# Patient Record
Sex: Female | Born: 1965 | Race: Black or African American | Hispanic: No | Marital: Single | Smoking: Current every day smoker
Health system: Southern US, Community
[De-identification: ages and names within clinical notes are randomized; demographics above are authoritative.]

## PROBLEM LIST (undated history)

## (undated) DIAGNOSIS — N289 Disorder of kidney and ureter, unspecified: Secondary | ICD-10-CM

## (undated) HISTORY — PX: ANKLE SURGERY: SHX546

---

## 2005-06-21 ENCOUNTER — Emergency Department: Payer: Self-pay | Admitting: Internal Medicine

## 2005-06-21 ENCOUNTER — Other Ambulatory Visit: Payer: Self-pay

## 2006-05-24 ENCOUNTER — Emergency Department: Payer: Self-pay | Admitting: Emergency Medicine

## 2006-07-07 ENCOUNTER — Other Ambulatory Visit: Payer: Self-pay

## 2006-07-07 ENCOUNTER — Emergency Department: Payer: Self-pay | Admitting: Emergency Medicine

## 2006-08-27 ENCOUNTER — Emergency Department: Payer: Self-pay | Admitting: General Practice

## 2006-08-27 ENCOUNTER — Other Ambulatory Visit: Payer: Self-pay

## 2006-09-26 ENCOUNTER — Emergency Department: Payer: Self-pay | Admitting: Emergency Medicine

## 2006-09-26 ENCOUNTER — Other Ambulatory Visit: Payer: Self-pay

## 2010-02-28 ENCOUNTER — Emergency Department: Payer: Self-pay | Admitting: Unknown Physician Specialty

## 2010-03-24 ENCOUNTER — Emergency Department: Payer: Self-pay | Admitting: Emergency Medicine

## 2012-03-13 ENCOUNTER — Emergency Department: Payer: Self-pay | Admitting: Emergency Medicine

## 2012-03-17 ENCOUNTER — Emergency Department: Payer: Self-pay | Admitting: Emergency Medicine

## 2014-05-17 IMAGING — CR FACIAL BONES - 1-2 VIEW
1 series · 4 of 4 positions shown · non-contrast
Comparison: none

REASON FOR EXAM: left peri-orbital lac/orbital contusion/head contusion
COMMENTS:   May transport without cardiac monitor

[Series 1: w waters pa · 0.14mm/px · 4 of 4 slices shown]
[im 1/4]
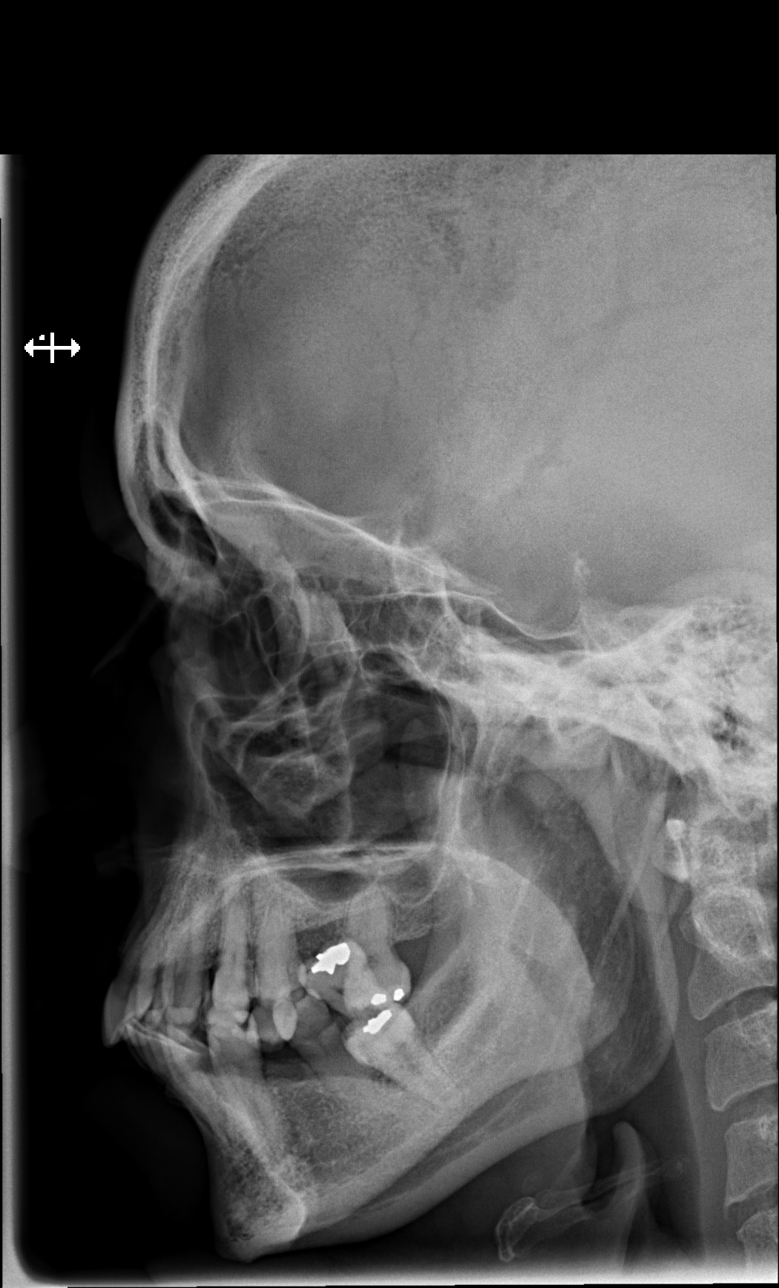
[im 2/4]
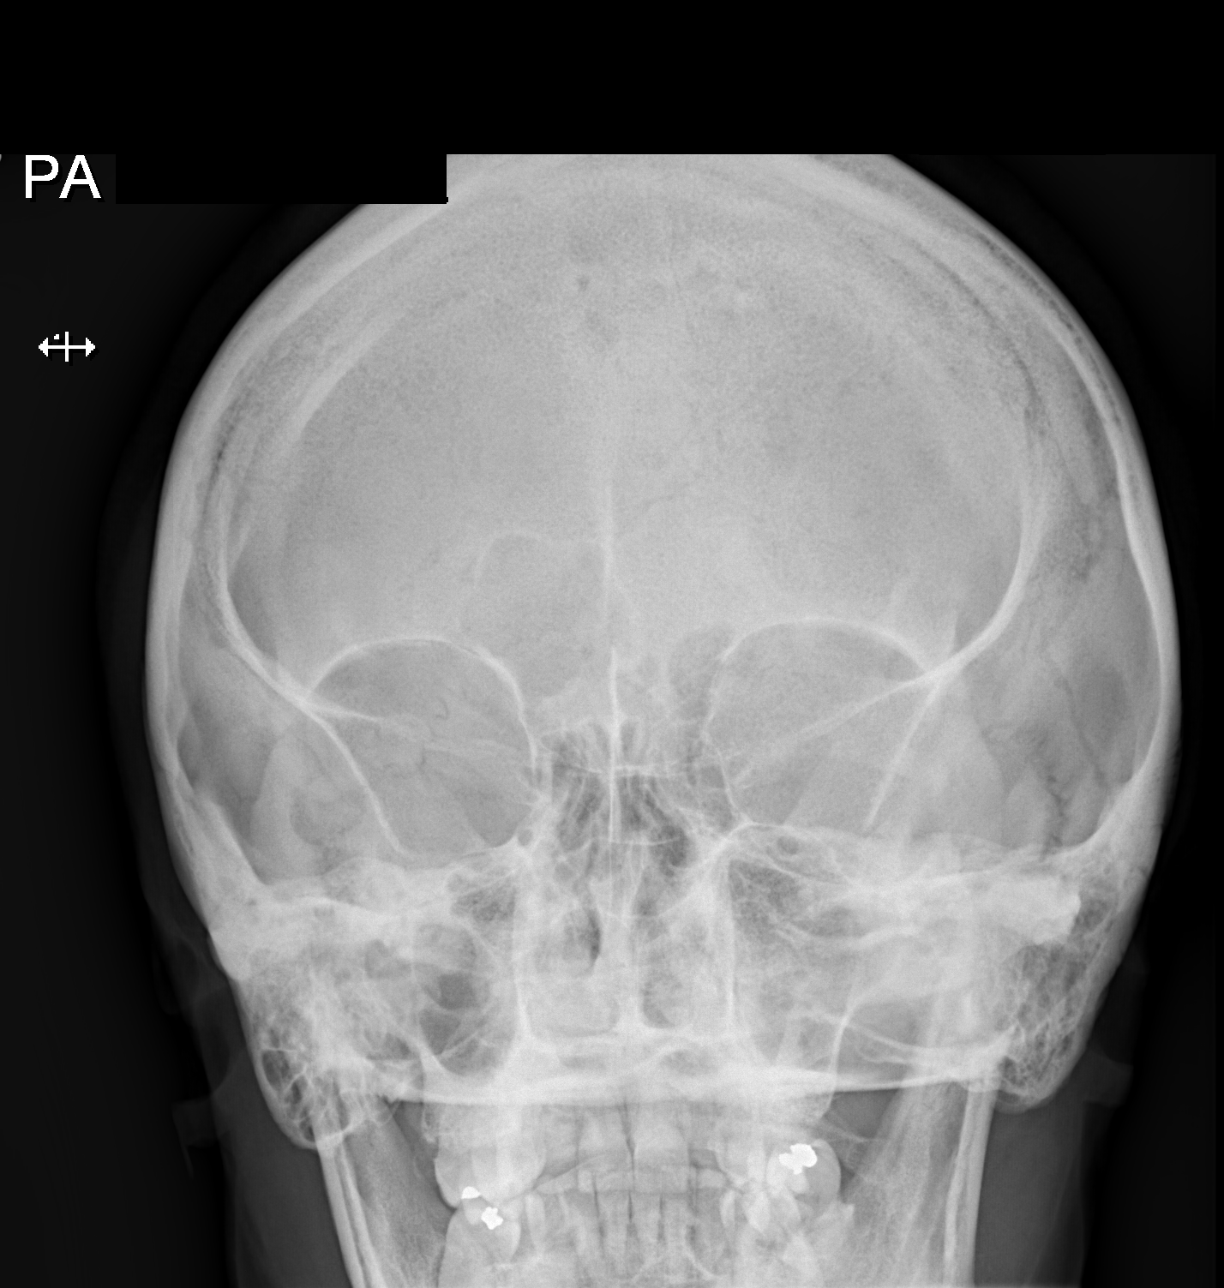
[im 3/4]
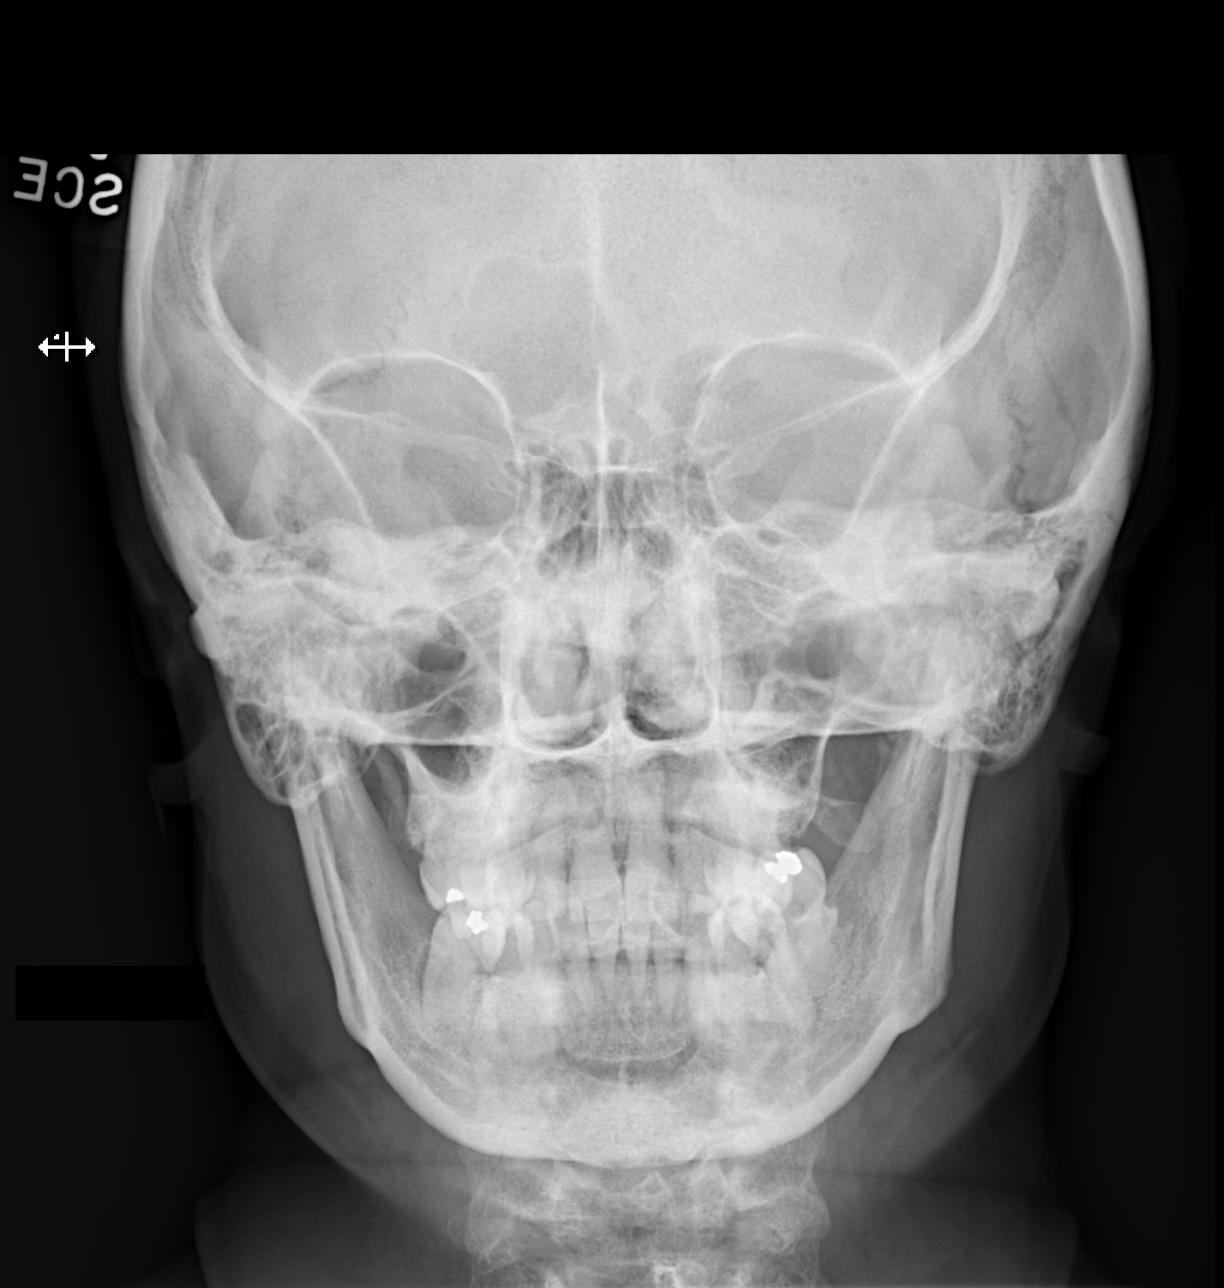
[im 4/4]
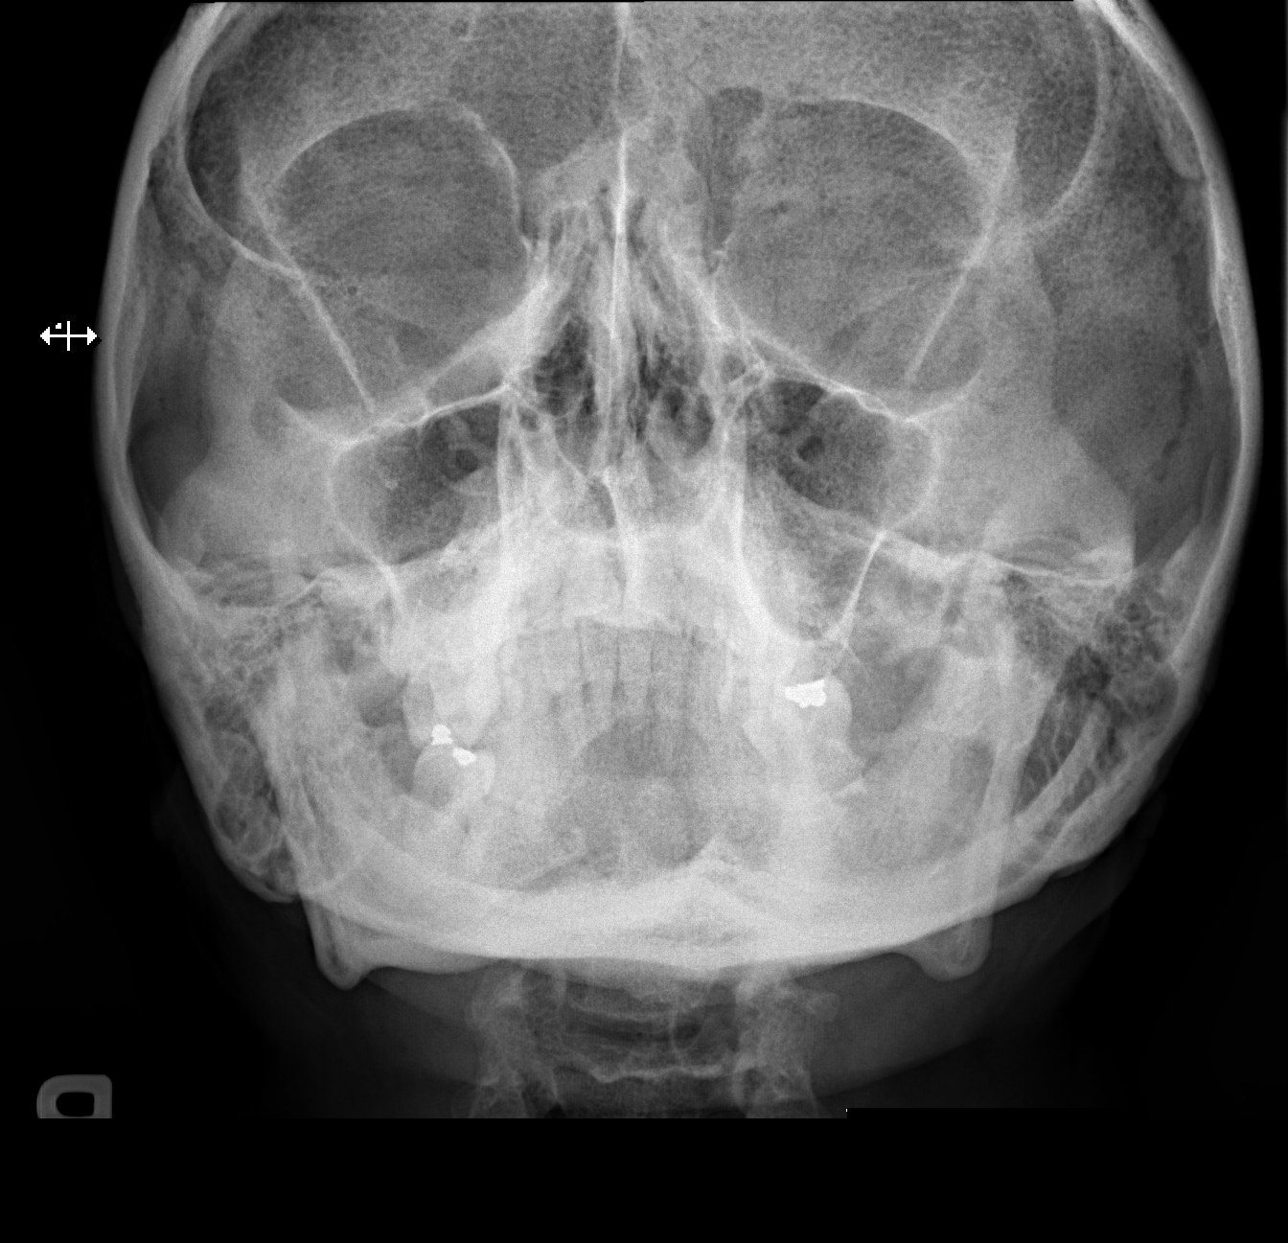

[4 of 4 positions shown; findings below may reference images not displayed]

PROCEDURE:     DXR - DXR FACIAL BONES LIMITED  - March 17, 2012 [DATE]

RESULT:     The facial bone images show minimal lucency in the floor of the
right orbit without depression. This could represent a foramen. The
possibility of a nondisplaced or incomplete fracture should be considered
and correlated clinically. There is either minimal mucosal thickening or
fluid in the floor the right maxillary sinus. The orbits are otherwise
unremarkable. The nasal septum is midline. The maxillary structures appear
within normal limits.
IMPRESSION: Possible foramen at the inferior orbital floor on the
right. Minimal nondisplaced fractures not completely excluded. Given the
history states the trauma was to the left side this may be an artifact. The
zygomatic arches are not fully evaluated given the lack of a
submentovertical projection.

[REDACTED]

## 2015-09-06 ENCOUNTER — Emergency Department: Payer: Self-pay

## 2015-09-06 ENCOUNTER — Encounter: Payer: Self-pay | Admitting: Emergency Medicine

## 2015-09-06 ENCOUNTER — Emergency Department
Admission: EM | Admit: 2015-09-06 | Discharge: 2015-09-06 | Disposition: A | Discharge status: Home / Self Care | Payer: Self-pay | Attending: Emergency Medicine | Admitting: Emergency Medicine

## 2015-09-06 DIAGNOSIS — R1032 Left lower quadrant pain: Secondary | ICD-10-CM

## 2015-09-06 DIAGNOSIS — F17210 Nicotine dependence, cigarettes, uncomplicated: Secondary | ICD-10-CM | POA: Insufficient documentation

## 2015-09-06 DIAGNOSIS — D259 Leiomyoma of uterus, unspecified: Secondary | ICD-10-CM | POA: Insufficient documentation

## 2015-09-06 DIAGNOSIS — R102 Pelvic and perineal pain: Secondary | ICD-10-CM

## 2015-09-06 HISTORY — DX: Disorder of kidney and ureter, unspecified: N28.9

## 2015-09-06 LAB — COMPREHENSIVE METABOLIC PANEL
ALBUMIN: 4.3 g/dL (ref 3.5–5.0)
ALT: 22 U/L (ref 14–54)
AST: 26 U/L (ref 15–41)
Alkaline Phosphatase: 46 U/L (ref 38–126)
Anion gap: 10 (ref 5–15)
BILIRUBIN TOTAL: 0.8 mg/dL (ref 0.3–1.2)
BUN: 19 mg/dL (ref 6–20)
CALCIUM: 9 mg/dL (ref 8.9–10.3)
CO2: 17 mmol/L — ABNORMAL LOW (ref 22–32)
CREATININE: 0.79 mg/dL (ref 0.44–1.00)
Chloride: 109 mmol/L (ref 101–111)
GFR calc Af Amer: >60 mL/min (ref >60)
GFR calc non Af Amer: >60 mL/min (ref >60)
GLUCOSE: 113 mg/dL — AB (ref 65–99)
Potassium: 3.5 mmol/L (ref 3.5–5.1)
Sodium: 136 mmol/L (ref 135–145)
TOTAL PROTEIN: 7.4 g/dL (ref 6.5–8.1)

## 2015-09-06 LAB — CBC
HEMATOCRIT: 41.5 % (ref 35.0–47.0)
Hemoglobin: 13.2 g/dL (ref 12.0–16.0)
MCH: 24.6 pg — AB (ref 26.0–34.0)
MCHC: 31.9 g/dL — ABNORMAL LOW (ref 32.0–36.0)
MCV: 77.1 fL — AB (ref 80.0–100.0)
PLATELETS: 182 10*3/uL (ref 150–440)
RBC: 5.38 MIL/uL — ABNORMAL HIGH (ref 3.80–5.20)
RDW: 14.5 % (ref 11.5–14.5)
WBC: 10.9 10*3/uL (ref 3.6–11.0)

## 2015-09-06 LAB — LIPASE, BLOOD: Lipase: 21 U/L (ref 11–51)

## 2015-09-06 MED ORDER — HYDROMORPHONE HCL 1 MG/ML IJ SOLN
1.0000 mg | Freq: Once | INTRAMUSCULAR | Status: AC
  Administered 2015-09-06: 1 mg via INTRAVENOUS

## 2015-09-06 MED ORDER — IOPAMIDOL (ISOVUE-300) INJECTION 61%
100.0000 mL | Freq: Once | INTRAVENOUS | Status: AC | PRN
  Administered 2015-09-06: 100 mL via INTRAVENOUS

## 2015-09-06 MED ORDER — DIATRIZOATE MEGLUMINE & SODIUM 66-10 % PO SOLN
15.0000 mL | Freq: Once | ORAL | Status: AC
  Administered 2015-09-06: 15 mL via ORAL

## 2015-09-06 MED ORDER — HYDROMORPHONE HCL 1 MG/ML IJ SOLN
1.0000 mg | Freq: Once | INTRAMUSCULAR | Status: AC
  Administered 2015-09-06: 1 mg via INTRAVENOUS
  Filled 2015-09-06: qty 1

## 2015-09-06 MED ORDER — METRONIDAZOLE 500 MG PO TABS: 500.0000 mg | ORAL_TABLET | Freq: Two times a day (BID) | ORAL | Status: DC

## 2015-09-06 MED ORDER — OXYCODONE-ACETAMINOPHEN 5-325 MG PO TABS: 1.0000 | ORAL_TABLET | Freq: Four times a day (QID) | ORAL | Status: DC | PRN

## 2015-09-06 MED ORDER — HYDROMORPHONE HCL 1 MG/ML IJ SOLN
INTRAMUSCULAR | Status: AC
  Administered 2015-09-06: 1 mg via INTRAVENOUS
  Filled 2015-09-06: qty 1

## 2015-09-06 NOTE — Discharge Instructions (Signed)
You were prescribed a medication that is potentially sedating. Do not drink alcohol, drive or participate in any other potentially dangerous activities while taking this medication as it may make you sleepy. Do not take this medication with any other sedating medications, either prescription or over-the-counter. If you were prescribed Percocet or Vicodin, do not take these with acetaminophen (Tylenol) as it is already contained within these medications.   Opioid pain medications (or "narcotics") can be habit forming.  Use it as little as possible to achieve adequate pain control.  Do not use or use it with extreme caution if you have a history of opiate abuse or dependence.  If you are on a pain contract with your primary care doctor or a pain specialist, be sure to let them know you were prescribed this medication today from the [facility].  This medication is intended for your use only - do not give any to anyone else and keep it in a secure place where nobody else, especially children and pets, have access to it.  It will also cause or worsen constipation, so you may want to consider taking an over-the-counter stool softener while you are taking this medication.  Abdominal Pain, Adult Many things can cause abdominal pain. Usually, abdominal pain is not caused by a disease and will improve without treatment. It can often be observed and treated at home. Your health care provider will do a physical exam and possibly order blood tests and X-rays to help determine the seriousness of your pain. However, in many cases, more time must pass before a clear cause of the pain can be found. Before that point, your health care provider may not know if you need more testing or further treatment. HOME CARE INSTRUCTIONS Monitor your abdominal pain for any changes. The following actions may help to alleviate any discomfort you are experiencing:  Only take over-the-counter or prescription  medicines as directed by your health care provider.  Do not take laxatives unless directed to do so by your health care provider.  Try a clear liquid diet (broth, tea, or water) as directed by your health care provider. Slowly move to a bland diet as tolerated. SEEK MEDICAL CARE IF:  You have unexplained abdominal pain.  You have abdominal pain associated with nausea or diarrhea.  You have pain when you urinate or have a bowel movement.  You experience abdominal pain that wakes you in the night.  You have abdominal pain that is worsened or improved by eating food.  You have abdominal pain that is worsened with eating fatty foods.  You have a fever. SEEK IMMEDIATE MEDICAL CARE IF:  Your pain does not go away within 2 hours.  You keep throwing up (vomiting).  Your pain is felt only in portions of the abdomen, such as the right side or the left lower portion of the abdomen.  You pass bloody or black tarry stools. MAKE SURE YOU:  Understand these instructions.  Will watch your condition.  Will get help right away if you are not doing well or get worse.   This information is not intended to replace advice given to you by your health care provider. Make sure you discuss any questions you have with your health care provider.   Document Released: 01/19/2005 Document Revised: 12/31/2014 Document Reviewed: 12/19/2012 Elsevier Interactive Patient Education 2016 Elsevier Inc.  Uterine Fibroids Uterine fibroids are tissue masses (tumors) that can develop in the womb (uterus). They are also called leiomyomas. This  type of tumor is not cancerous (benign) and does not spread to other parts of the body outside of the pelvic area, which is between the hip bones. Occasionally, fibroids may develop in the fallopian tubes, in the cervix, or on the support structures (ligaments) that surround the uterus. You can have one or many fibroids. Fibroids can vary in size, weight, and where they grow  in the uterus. Some can become quite large. Most fibroids do not require medical treatment. CAUSES A fibroid can develop when a single uterine cell keeps growing (replicating). Most cells in the human body have a control mechanism that keeps them from replicating without control. SIGNS AND SYMPTOMS Symptoms may include:   Heavy bleeding during your period.  Bleeding or spotting between periods.  Pelvic pain and pressure.  Bladder problems, such as needing to urinate more often (urinary frequency) or urgently.  Inability to reproduce offspring (infertility).  Miscarriages. DIAGNOSIS Uterine fibroids are diagnosed through a physical exam. Your health care provider may feel the lumpy tumors during a pelvic exam. Ultrasonography and an MRI may be done to determine the size, location, and number of fibroids. TREATMENT Treatment may include:  Watchful waiting. This involves getting the fibroid checked by your health care provider to see if it grows or shrinks. Follow your health care provider's recommendations for how often to have this checked.  Hormone medicines. These can be taken by mouth or given through an intrauterine device (IUD).  Surgery.  Removing the fibroids (myomectomy) or the uterus (hysterectomy).  Removing blood supply to the fibroids (uterine artery embolization). If fibroids interfere with your fertility and you want to become pregnant, your health care provider may recommend having the fibroids removed.  HOME CARE INSTRUCTIONS  Keep all follow-up visits as directed by your health care provider. This is important.  Take medicines only as directed by your health care provider.  If you were prescribed a hormone treatment, take the hormone medicines exactly as directed.  Do not take aspirin, because it can cause bleeding.  Ask your health care provider about taking iron pills and increasing the amount of dark green, leafy vegetables in your diet. These actions can  help to boost your blood iron levels, which may be affected by heavy menstrual bleeding.  Pay close attention to your period and tell your health care provider about any changes, such as:  Increased blood flow that requires you to use more pads or tampons than usual per month.  A change in the number of days that your period lasts per month.  A change in symptoms that are associated with your period, such as abdominal cramping or back pain. SEEK MEDICAL CARE IF:  You have pelvic pain, back pain, or abdominal cramps that cannot be controlled with medicines.  You have an increase in bleeding between and during periods.  You soak tampons or pads in a half hour or less.  You feel lightheaded, extra tired, or weak. SEEK IMMEDIATE MEDICAL CARE IF:  You faint.  You have a sudden increase in pelvic pain.   This information is not intended to replace advice given to you by your health care provider. Make sure you discuss any questions you have with your health care provider.   Document Released: 04/08/2000 Document Revised: 05/02/2014 Document Reviewed: 10/08/2013 Elsevier Interactive Patient Education [employer].

## 2015-09-06 NOTE — ED Notes (Signed)
Pt waiting in room for ride

## 2015-09-06 NOTE — ED Notes (Signed)
Pt taken to US

## 2015-09-06 NOTE — ED Notes (Signed)
Pt to rm 25 via ems from home, report LLQ pain starting yesterday, 10/10, described as constant and sharp.  Pt denies n/v/d, reports dizziness.  Pt hx kidney problems.  Pt NAD at this time, respirations equal and unlabored, skin warm and dry.

## 2015-09-06 NOTE — ED Provider Notes (Signed)
[facility] Emergency Department Provider Note  ____________________________________________  Time seen: 3:00 AM  I have reviewed the triage vital signs and the nursing notes.   HISTORY  Chief Complaint Abdominal Pain    HPI Mackenzie Odonnell is a 50 y.o. female who complains of left lower quadrant abdominal pain that started yesterday morning. It was gradual onset, gradually worsened throughout the day until spot 10 out of 10 this morning. She was unable to get comfortable enough to sleep tonight. It's constant and sharp, nonradiating. No associated nausea vomiting diarrhea dysuria frequency urgency vaginal bleeding or discharge. She uses Depo-Provera, last injection one month ago.No aggravating or alleviating factors.     Past Medical History  Diagnosis Date  . Renal disorder      There are no active problems to display for this patient.    History reviewed. No pertinent past surgical history.   Current Outpatient Rx  Name  Route  Sig  Dispense  Refill  . metroNIDAZOLE (FLAGYL) 500 MG tablet   Oral   Take 1 tablet (500 mg total) by mouth 2 (two) times daily.   14 tablet   0   . oxyCODONE-acetaminophen (ROXICET) 5-325 MG tablet   Oral   Take 1 tablet by mouth every 6 (six) hours as needed for severe pain.   12 tablet   0    None  Allergies Review of patient's allergies indicates no known allergies.   History reviewed. No pertinent family history.  Social History Social History  Substance Use Topics  . Smoking status: Current Every Day Smoker -- 0.50 packs/day    Types: Cigarettes  . Smokeless tobacco: None  . Alcohol Use: Yes    Review of Systems  Constitutional:   No fever or chills.  Cardiovascular:   No chest pain. Respiratory:   No dyspnea or cough. Gastrointestinal:   Left lower quadrant abdominal pain as above without vomiting and diarrhea.  Genitourinary:   Negative for dysuria or difficulty  urinating. Musculoskeletal:   Negative for focal pain or swelling Neurological:   Negative for headaches 10-point ROS otherwise negative.  ____________________________________________   PHYSICAL EXAM:  VITAL SIGNS: ED Triage Vitals  Enc Vitals Group     BP 09/06/15 0307 146/102 mmHg     Pulse Rate 09/06/15 0307 81     Resp 09/06/15 0307 20     Temp 09/06/15 0307 98.2 F (36.8 C)     Temp Source 09/06/15 0307 Oral     SpO2 09/06/15 0307 98 %     Weight 09/06/15 0307 183 lb (83.008 kg)     Height 09/06/15 0307 5\' 4"  (1.626 m)     Head Cir --      Peak Flow --      Pain Score 09/06/15 0307 10     Pain Loc --      Pain Edu? --      Excl. in [place]? --     Vital signs reviewed, nursing assessments reviewed.   Constitutional:   Alert and oriented. Uncomfortable appearing. Eyes:   No scleral icterus. No conjunctival pallor. PERRL. EOMI.  No nystagmus. ENT   Head:   Normocephalic and atraumatic.   Nose:   No congestion/rhinnorhea. No septal hematoma   Mouth/Throat:   MMM, no pharyngeal erythema. No peritonsillar mass.    Neck:   No stridor. No SubQ emphysema. No meningismus. Hematological/Lymphatic/Immunilogical:   No cervical lymphadenopathy. Cardiovascular:   RRR. Symmetric bilateral radial and DP pulses.  No murmurs.  Respiratory:   Normal respiratory effort without tachypnea nor retractions. Breath sounds are clear and equal bilaterally. No wheezes/rales/rhonchi. Gastrointestinal:   Soft with suprapubic and left lower quadrant tenderness. Non distended. There is no CVA tenderness.  No rebound, rigidity, or guarding. Genitourinary:   deferred Musculoskeletal:   Nontender with normal range of motion in all extremities. No joint effusions.  No lower extremity tenderness.  No edema. Neurologic:   Normal speech and language.  CN 2-10 normal. Motor grossly intact. No gross focal neurologic deficits are appreciated.  Skin:    Skin is warm, dry and intact. No rash  noted.  No petechiae, purpura, or bullae.  ____________________________________________    LABS (pertinent positives/negatives) (all labs ordered are listed, but only abnormal results are displayed) Labs Reviewed  COMPREHENSIVE METABOLIC PANEL - Abnormal; Notable for the following:    CO2 17 (*)    Glucose, Bld 113 (*)    All other components within normal limits  CBC - Abnormal; Notable for the following:    RBC 5.38 (*)    MCV 77.1 (*)    MCH 24.6 (*)    MCHC 31.9 (*)    All other components within normal limits  LIPASE, BLOOD   ____________________________________________   EKG  Interpreted by me Normal sinus rhythm rate of 69, normal axis and intervals. Poor R-wave progression in anterior precordial leads. Normal ST segments and T waves. No STEMI. No acute ischemic changes.  ____________________________________________    RADIOLOGY  CT abdomen and pelvis unremarkable except for uterine fibroids  ____________________________________________   PROCEDURES   ____________________________________________   INITIAL IMPRESSION / ASSESSMENT AND PLAN / ED COURSE  Pertinent labs & imaging results that were available during my care of the patient were reviewed by me and considered in my medical decision making (see chart for details).  Patient presents with history abdominal pain. CT unremarkable, labs unremarkable. Patient with persistent pain at 5 AM after reviewing our results. We'll proceed with ultrasound to further evaluate the likely uterine fibroids.  ----------------------------------------- 7:10 AM on 09/06/2015 -----------------------------------------  Ultrasound pelvis pending. Patient is signed out to Dr. Iran Sizer to follow up on ultrasound. In the absence of any additional significant findings, we'll plan to discharge the patient home to follow up with gynecology for symptomatic uterine fibroids with prescription for pain medicine and empiric Flagyl.  Low suspicion for STI or PID or torsion. No evidence of appendicitis perforation obstruction or diverticulitis.     ____________________________________________   FINAL CLINICAL IMPRESSION(S) / ED DIAGNOSES  Final diagnoses:  LLQ abdominal pain  Uterine leiomyoma, unspecified location       Portions of this note were generated with dragon dictation software. Dictation errors may occur despite best attempts at proofreading.   Carrie Mew, MD 09/06/15 [phone]

## 2015-09-16 ENCOUNTER — Encounter: Payer: Self-pay | Admitting: Emergency Medicine

## 2015-09-16 DIAGNOSIS — Z5321 Procedure and treatment not carried out due to patient leaving prior to being seen by health care provider: Secondary | ICD-10-CM | POA: Insufficient documentation

## 2015-09-16 DIAGNOSIS — F17210 Nicotine dependence, cigarettes, uncomplicated: Secondary | ICD-10-CM | POA: Insufficient documentation

## 2015-09-16 DIAGNOSIS — R109 Unspecified abdominal pain: Secondary | ICD-10-CM | POA: Insufficient documentation

## 2015-09-16 MED ORDER — OXYCODONE-ACETAMINOPHEN 5-325 MG PO TABS
ORAL_TABLET | ORAL | Status: AC
  Administered 2015-09-16: 1 via ORAL
  Filled 2015-09-16: qty 1

## 2015-09-16 MED ORDER — OXYCODONE-ACETAMINOPHEN 5-325 MG PO TABS
1.0000 | ORAL_TABLET | Freq: Once | ORAL | Status: AC
  Administered 2015-09-16: 1 via ORAL

## 2015-09-16 NOTE — ED Notes (Signed)
Spoke with Dr Beather Arbour regarding pt; med order obtained only, no labs at this time; pt informed of plan of care and st "oh my they gave me morphine last time"; instructed pt that since she has been taking percocet with relief, we will admin same while waiting in the lobby and she can review with the ED provider further care; pt st "oh, I should have just come on the ambulance then"; pt informed that just because she takes EMS transportation, that does not mean she will be taken immediately to an exam room; pt voices understanding

## 2015-09-16 NOTE — ED Notes (Signed)
Patient ambulatory to triage with steady gait, without difficulty or distress noted; st dx with fibroid tumor & rec hysterectomy; has appt in morning with Dr Kenton Kingfisher but having increased pain; rx percocet but ran out

## 2015-09-17 ENCOUNTER — Emergency Department
Admission: EM | Admit: 2015-09-17 | Discharge: 2015-09-17 | Disposition: A | Discharge status: Home / Self Care | Payer: Self-pay | Attending: Emergency Medicine | Admitting: Emergency Medicine

## 2016-10-31 IMAGING — US US PELVIS COMPLETE
1 series · 13 of 25 positions shown · non-contrast
Comparison: CT 09/06/2015

CLINICAL DATA: Severe left pelvic pain since yesterday.

EXAM:
TRANSABDOMINAL AND TRANSVAGINAL ULTRASOUND OF PELVIS
TECHNIQUE: Both transabdominal and transvaginal ultrasound examinations of the
pelvis were performed. Transabdominal technique was performed for
global imaging of the pelvis including uterus, ovaries, adnexal
regions, and pelvic cul-de-sac. It was necessary to proceed with
endovaginal exam following the transabdominal exam to visualize the
endometrium and ovaries.

[Series 1: us pelvis complete · 0.24mm/px · 13 of 80 slices shown]
[im 1/80]
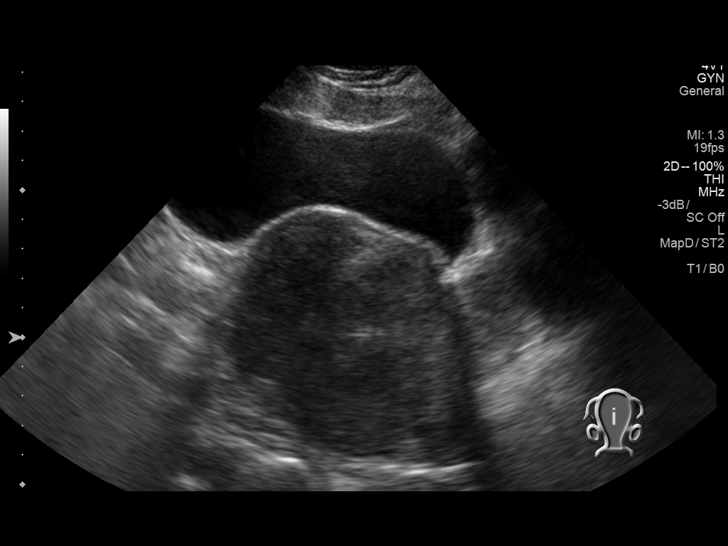
[im 7/80]
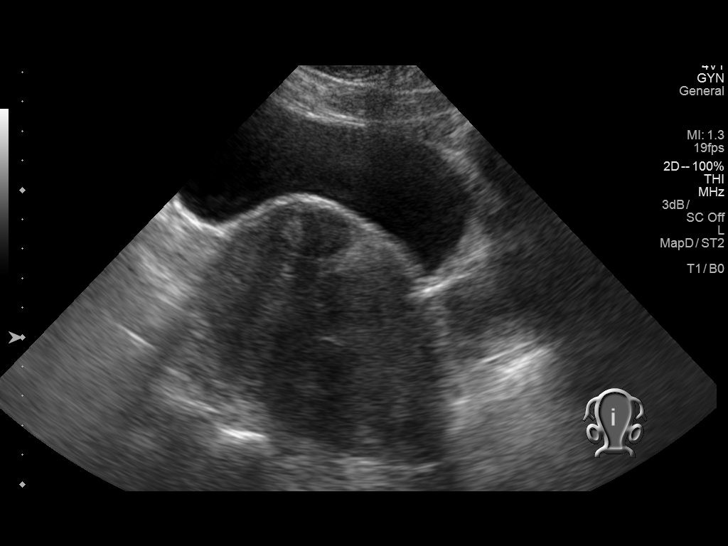
[im 14/80]
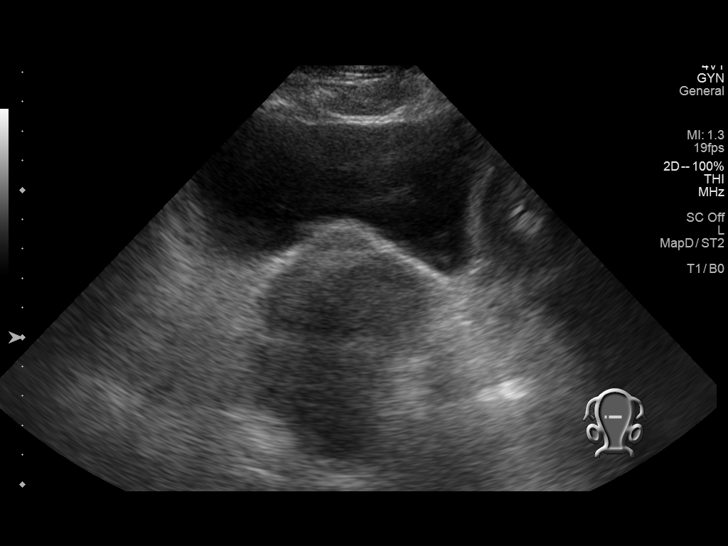
[im 20/80]
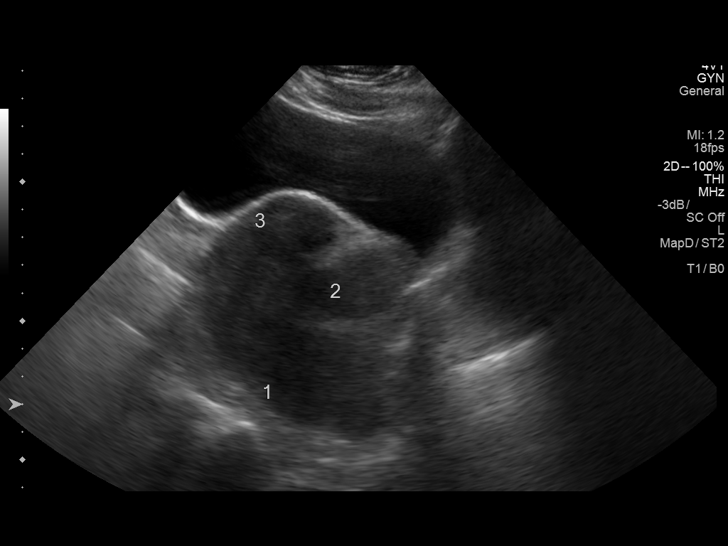
[im 27/80]
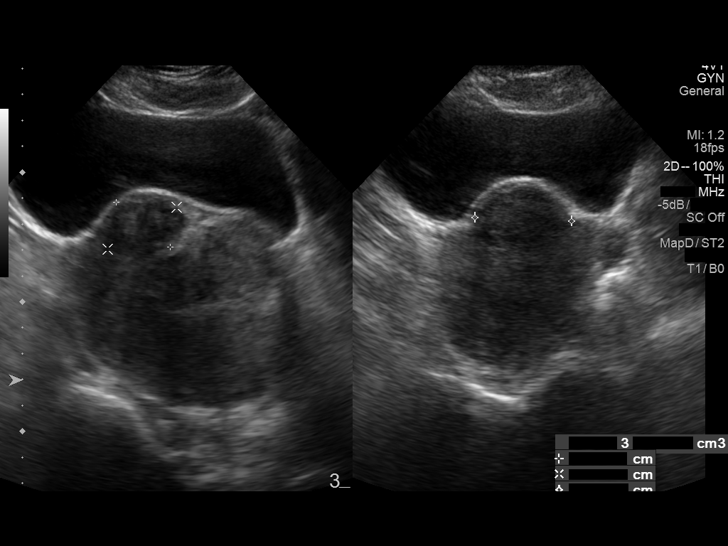
[im 33/80]
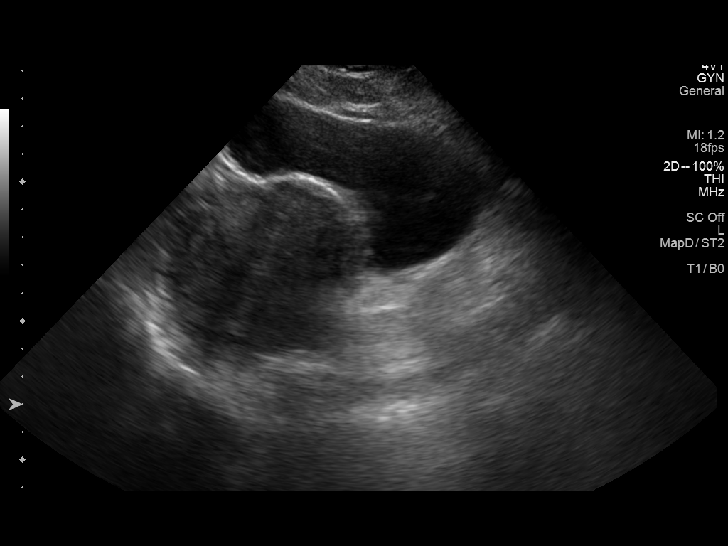
[im 40/80]
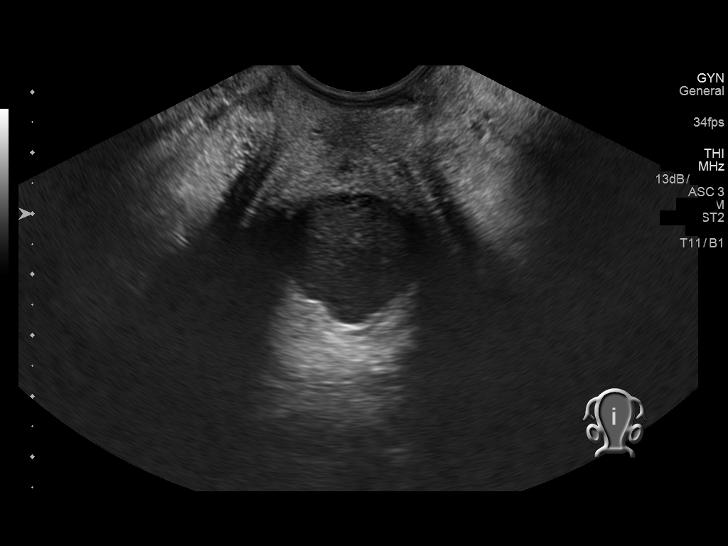
[im 47/80]
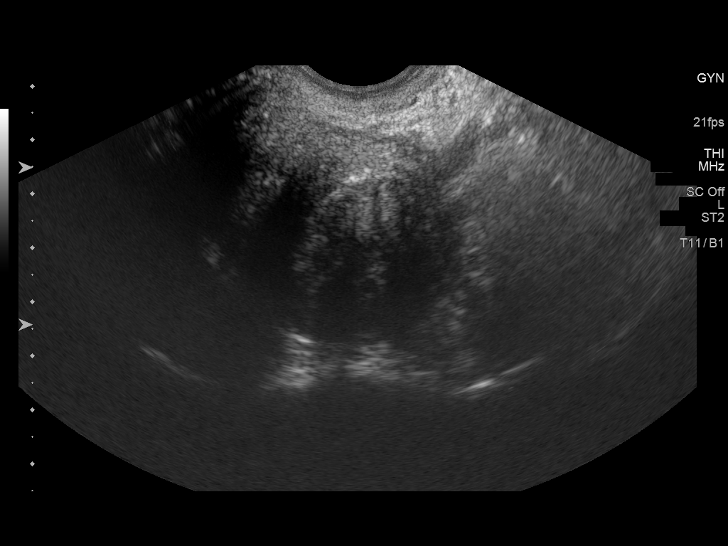
[im 53/80]
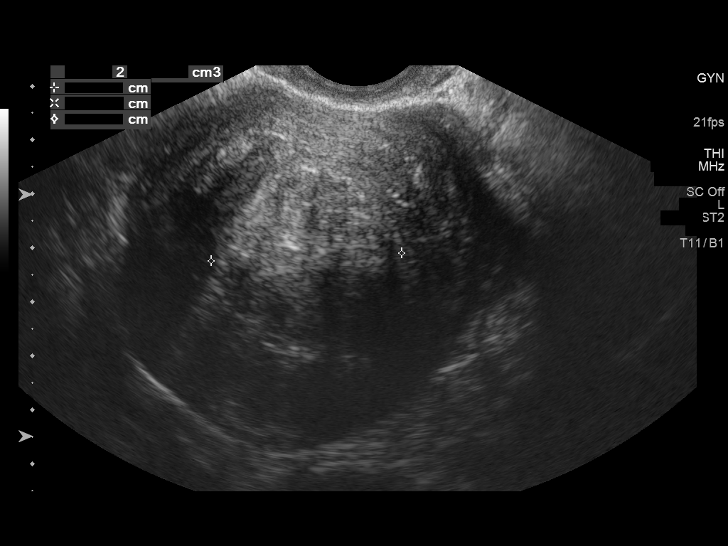
[im 60/80]
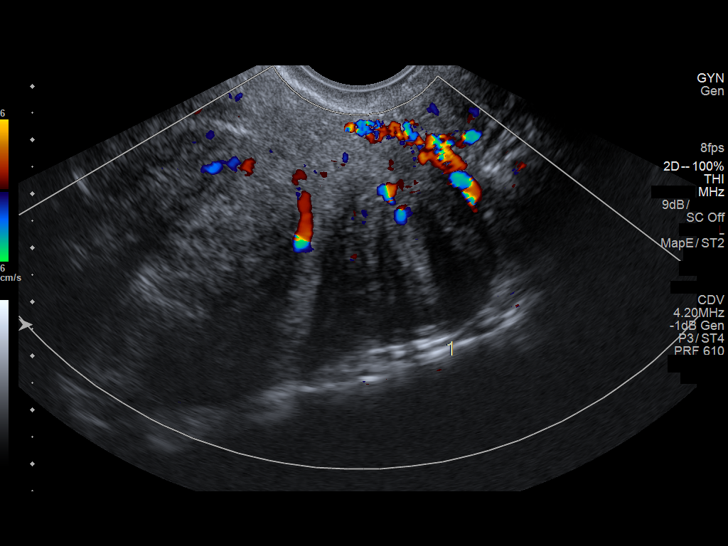
[im 66/80]
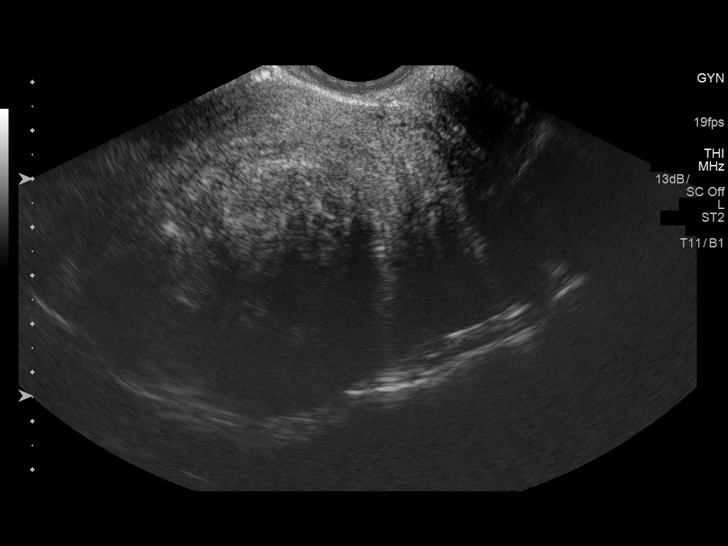
[im 73/80]
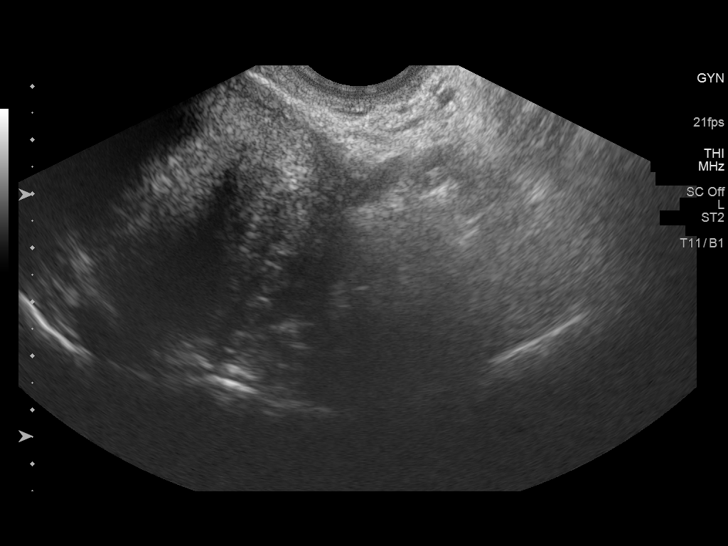
[im 80/80]
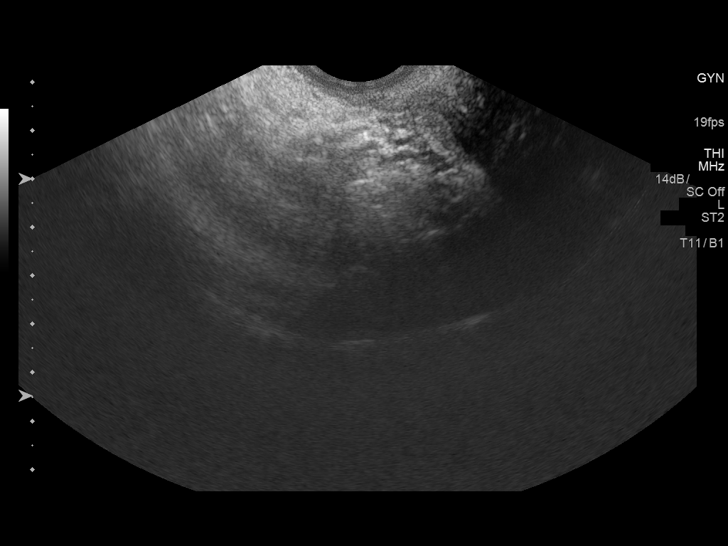

[13 of 25 positions shown; findings below may reference images not displayed]

FINDINGS: Uterus

Measurements: 7.2 x 7.7 x 10.3 cm. Multiple intramural fibroids with
right fundal fibroid measuring 3.1 x 3.1 x 3.2 cm, posterior lower
uterine segment fibroid measuring 2.4 x 3.0 x 3.0 cm and fibroid
over the central uterus obscuring the endometrium as this may be
intramural or submucosal in nature measuring 3.5 x 3.6 x 3.8 cm.

Endometrium

Not accurately visualized due to multiple fibroids.

Right ovary

Nonvisualized.

Left ovary

Not visualized.

Other findings

No abnormal free fluid. Minimally complicated 2.7 cm oval cystic
structure between the vaginal canal and urethra likely Dj-Mauro
Shabbir cyst.
IMPRESSION: Mildly enlarged uterus with at least 3 fibroids as described. One of
these fibroids is centered over the endometrium and may be
submucosal in nature measuring 3.5 x 3.6 x 3.8 cm. Endometrium not
accurately visualized.

Ovaries not visualized.

Gilbert Corella cyst.

## 2017-04-13 ENCOUNTER — Emergency Department
Admission: EM | Admit: 2017-04-13 | Discharge: 2017-04-13 | Disposition: A | Discharge status: Home / Self Care | Payer: Self-pay | Attending: Emergency Medicine | Admitting: Emergency Medicine

## 2017-04-13 ENCOUNTER — Encounter: Payer: Self-pay | Admitting: Emergency Medicine

## 2017-04-13 ENCOUNTER — Other Ambulatory Visit: Payer: Self-pay

## 2017-04-13 DIAGNOSIS — X58XXXA Exposure to other specified factors, initial encounter: Secondary | ICD-10-CM | POA: Insufficient documentation

## 2017-04-13 DIAGNOSIS — Y999 Unspecified external cause status: Secondary | ICD-10-CM | POA: Insufficient documentation

## 2017-04-13 DIAGNOSIS — Y939 Activity, unspecified: Secondary | ICD-10-CM | POA: Insufficient documentation

## 2017-04-13 DIAGNOSIS — K029 Dental caries, unspecified: Secondary | ICD-10-CM | POA: Insufficient documentation

## 2017-04-13 DIAGNOSIS — Y929 Unspecified place or not applicable: Secondary | ICD-10-CM | POA: Insufficient documentation

## 2017-04-13 DIAGNOSIS — S025XXA Fracture of tooth (traumatic), initial encounter for closed fracture: Secondary | ICD-10-CM | POA: Insufficient documentation

## 2017-04-13 MED ORDER — TRAMADOL HCL 50 MG PO TABS: 50.0000 mg | ORAL_TABLET | Freq: Two times a day (BID) | ORAL | 0 refills | Status: AC

## 2017-04-13 MED ORDER — PENICILLIN V POTASSIUM 500 MG PO TABS: 500.0000 mg | ORAL_TABLET | Freq: Three times a day (TID) | ORAL | 0 refills | Status: AC

## 2017-04-13 MED ORDER — PENICILLIN V POTASSIUM 500 MG PO TABS
500.0000 mg | ORAL_TABLET | Freq: Once | ORAL | Status: AC
  Administered 2017-04-13: 500 mg via ORAL
  Filled 2017-04-13: qty 1

## 2017-04-13 MED ORDER — LIDOCAINE VISCOUS 2 % MT SOLN
15.0000 mL | Freq: Once | OROMUCOSAL | Status: AC
  Administered 2017-04-13: 15 mL via OROMUCOSAL
  Filled 2017-04-13: qty 15

## 2017-04-13 MED ORDER — TRAMADOL HCL 50 MG PO TABS
50.0000 mg | ORAL_TABLET | Freq: Once | ORAL | Status: AC
  Administered 2017-04-13: 50 mg via ORAL
  Filled 2017-04-13: qty 1

## 2017-04-13 NOTE — ED Triage Notes (Signed)
Left side dental pain x 3-4 weeks worse over the past 2 weeks, taking ibuprofen no relief. No dentist, does not have insurance.

## 2017-04-13 NOTE — Discharge Instructions (Signed)
You have a broken molar with some exposed dentin & nerve. You should rinse with warm salty water after every meal. Use a soft-bristle toothbrush to brush twice daily. Take the antibiotic as directed and the pain medicine as needed. Follow-up with [facility] for dental repair or extractions.  OPTIONS FOR DENTAL FOLLOW UP CARE  [place] [facility] and [place] [email or web address] [phone])  [facility] [phone])  [place] [phone] ext [phone])  [place] [phone])  [facility] [phone]) This clinic caters to the indigent population and is on a lottery system. Location: [employer] [facility], [place], [place], [place] Clinic Hours: Wednesdays from 6pm - 9pm, patients seen by a lottery system. For dates, call or go to [email or web address] Services: Cleanings, fillings and simple extractions. Payment Options: DENTAL WORK IS FREE OF CHARGE. Bring proof of income or support. Best way to get seen: Arrive at 5:15 pm - this is a lottery, NOT first come/first serve, so arriving earlier will not increase your chances of being seen.     [facility] [place] [phone] Select option 1 for emergencies   Location: [facility], [place], [address], [place] Clinic Hours: No walk-ins accepted - call the day before to schedule an appointment. Check in times are 9:30 am and 1:30 pm. Services: Simple extractions, temporary fillings, pulpectomy/pulp debridement, uncomplicated abscess drainage. Payment Options: PAYMENT IS DUE AT THE TIME OF SERVICE.  Fee is usually $100-200, additional surgical procedures (e.g. abscess drainage) may be extra. Cash, checks, Visa/MasterCard accepted.  Can file Medicaid if patient is covered  for dental - patient should call case worker to check. No discount for [facility] patients. Best way to get seen: MUST call the day before and get onto the schedule. Can usually be seen the next 1-2 days. No walk-ins accepted.     [place] [phone]   Location: [place], [place] [facility] Hours: M, W, Th, F 8am or 1:30pm, Tues 9a or 1:30 - first come/first served. Services: Simple extractions, temporary fillings, uncomplicated abscess drainage.  You do not need to be an [facility] resident. Payment Options: PAYMENT IS DUE AT THE TIME OF SERVICE. Dental insurance, otherwise sliding scale - bring proof of income or support. Depending on income and treatment needed, cost is usually $50-200. Best way to get seen: Arrive early as it is first come/first served.     [facility] [phone]   Location: [place] [facility] Hours: Mon-Thu 8a-5p Services: Most basic dental services including extractions and fillings. Payment Options: PAYMENT IS DUE AT THE TIME OF SERVICE. Sliding scale, up to 50% off - bring proof if income or support. Medicaid with dental option accepted. Best way to get seen: Call to schedule an appointment, can usually be seen within 2 weeks OR they will try to see walk-ins - show up at [place] or 2p (you may have to wait).     [facility] [place] RESIDENTS ONLY   Location: [facility], [place] [address] [place], [place] [address] Clinic Hours: By appointment only. Monday - Thursday 8am-5pm, Friday 8am-12pm Services: Cleanings, fillings, extractions. Payment Options: PAYMENT IS DUE AT THE TIME OF SERVICE. Cash, Visa or MasterCard. Sliding scale - $30 minimum per service. Best way to get seen: Come in  to office, complete packet and make an appointment - need proof of income or support monies for  each household member and proof of [facility] residence. Usually takes about a month to get in.     [facility] [phone]   Location: [address], [place] Clinic Hours: Walk-in Urgent Care Dental Services are offered Monday-Friday mornings only. The numbers of emergencies accepted daily is limited to the number of providers available. Maximum 15 - Mondays, Wednesdays & Thursdays Maximum 10 - Tuesdays & Fridays Services: You do not need to be a [facility] resident to be seen for a dental emergency. Emergencies are defined as pain, swelling, abnormal bleeding, or dental trauma. Walkins will receive x-rays if needed. NOTE: Dental cleaning is not an emergency. Payment Options: PAYMENT IS DUE AT THE TIME OF SERVICE. Minimum co-pay is $40.00 for uninsured patients. Minimum co-pay is $3.00 for Medicaid with dental coverage. Dental Insurance is accepted and must be presented at time of visit. Medicare does not cover dental. Forms of payment: Cash, credit card, checks. Best way to get seen: If not previously registered with the clinic, walk-in dental registration begins at 7:15 am and is on a first come/first serve basis. If previously registered with the clinic, call to make an appointment.     [facility] [place] ONLY   Location: [address], [place], [place] Clinic Hours: Mon-Thu 10a-2p Services: Extractions only! Payment Options: FREE (donations accepted) - bring proof of income or support Best way to get seen: Call and schedule an appointment OR come at 8am on the 1st Monday of every month (except for holidays) when it is first come/first served.     [facility] [phone]   Location: [place], [place] Clinic Hours: Friday mornings Services, Payment Options, Best way to get seen: Call for info

## 2017-04-13 NOTE — ED Provider Notes (Signed)
[facility] Emergency Department Provider Note ____________________________________________  Time seen: 1008  I have reviewed the triage vital signs and the nursing notes.  HISTORY  Chief Complaint  Dental Pain  HPI Mackenzie Odonnell is a 51 y.o. female presents to the ED for a 3-4-week complaint of intermittent dental pain and facial swelling.  Patient reports pain to a chronically broken molar on the lower left jaw.  She also reports subjective, intermittent fevers and chills.  She denies any nausea, vomiting, dizziness, or chest pain.  She has been using ibuprofen with no significant benefit.  She has not had a contact [facility], but was told she needs $100 cash for her initial x-rays and evaluation.  Past Medical History:  Diagnosis Date  . Renal disorder     There are no active problems to display for this patient.   Past Surgical History:  Procedure Laterality Date  . ANKLE SURGERY      Prior to Admission medications   Medication Sig Start Date End Date Taking? Authorizing Provider  penicillin v potassium (VEETID) 500 MG tablet Take 1 tablet (500 mg total) by mouth 3 (three) times daily for 10 days. 04/13/17 04/23/17  Gresham Caetano, Dannielle Karvonen, PA-C  traMADol (ULTRAM) 50 MG tablet Take 1 tablet (50 mg total) by mouth 2 (two) times daily. 04/13/17   Azlan Hanway, Dannielle Karvonen, PA-C    Allergies Patient has no known allergies.  History reviewed. No pertinent family history.  Social History Social History   Tobacco Use  . Smoking status: Current Every Day Smoker    Packs/day: 0.50    Types: Cigarettes  . Smokeless tobacco: Never Used  Substance Use Topics  . Alcohol use: Yes  . Drug use: Not on file    Review of Systems  Constitutional: Negative for fever. Eyes: Negative for visual changes. ENT: Negative for sore throat. Dental pain as above Cardiovascular: Negative for chest pain. Respiratory: Negative for shortness of  breath. Gastrointestinal: Negative for abdominal pain, vomiting and diarrhea.  ____________________________________________  PHYSICAL EXAM:  VITAL SIGNS: ED Triage Vitals  Enc Vitals Group     BP 04/13/17 0953 118/84     Pulse Rate 04/13/17 0953 77     Resp 04/13/17 0953 18     Temp 04/13/17 0953 98.6 F (37 C)     Temp Source 04/13/17 0953 Oral     SpO2 04/13/17 0953 99 %     Weight 04/13/17 0954 180 lb (81.6 kg)     Height 04/13/17 0954 5\' 5"  (1.651 m)     Head Circumference --      Peak Flow --      Pain Score 04/13/17 0951 10     Pain Loc --      Pain Edu? --      Excl. in [place]? --     Constitutional: Alert and oriented. Well appearing and in no distress. Head: Normocephalic and atraumatic. Eyes: Conjunctivae are normal. Normal extraocular movements Mouth/Throat: Mucous membranes are moist.  Uvula is midline and tonsils are flat.  No oropharyngeal lesions are appreciated.  Patient without any brawny, sublingual edema noted.  She does have a chronically broken left second lower molar.  No focal gum edema, fluctuance, or pointing is noted. Neck: Supple. No thyromegaly.  Range of motion is normal. Hematological/Lymphatic/Immunological: No cervical lymphadenopathy. Cardiovascular: Normal rate, regular rhythm. Normal distal pulses. Respiratory: Normal respiratory effort. No wheezes/rales/rhonchi. ____________________________________________  PROCEDURES  Procedures Pen VK 500 mg  PO Ultram 50 mg PO 2% viscous lidocaine - apply topically ____________________________________________  INITIAL IMPRESSION / ASSESSMENT AND PLAN / ED COURSE  ED evaluation of intermittent dental pain in the left lower jaw.  Patient's exam is overall benign she is discharged with a prescription for Pen-Vee K and as well as a small prescription for Ultram.  She will follow-up with [facility] for definitive management.  I reviewed the patient's prescription history over the last 12  months in the multi-state controlled substances database(s) that includes [place], [place], [place], [place], [place], [place], [place], [place], [place], [place], [place], [place], [place], and [place].  Results were notable for no recent prescriptions within one year. ____________________________________________  FINAL CLINICAL IMPRESSION(S) / ED DIAGNOSES  Final diagnoses:  Pain due to dental caries  Closed fracture of tooth, initial encounter     Melvenia Needles, PA-C 04/13/17 1036    Lisa Roca, MD 04/13/17 1556

## 2018-01-29 ENCOUNTER — Ambulatory Visit
Admission: RE | Admit: 2018-01-29 | Discharge: 2018-01-29 | Disposition: A | Discharge status: Home / Self Care | Payer: Self-pay | Source: Ambulatory Visit | Attending: Family Medicine | Admitting: Family Medicine

## 2018-01-29 ENCOUNTER — Other Ambulatory Visit (HOSPITAL_COMMUNITY): Payer: Self-pay | Admitting: Family Medicine

## 2018-01-29 DIAGNOSIS — R7611 Nonspecific reaction to tuberculin skin test without active tuberculosis: Secondary | ICD-10-CM

## 2019-08-02 ENCOUNTER — Ambulatory Visit: Payer: Self-pay

## 2019-08-10 ENCOUNTER — Ambulatory Visit: Payer: Self-pay

## 2019-08-10 ENCOUNTER — Ambulatory Visit: Payer: Self-pay | Attending: Internal Medicine

## 2019-08-10 DIAGNOSIS — Z23 Encounter for immunization: Secondary | ICD-10-CM

## 2019-08-10 NOTE — Progress Notes (Signed)
   Covid-19 Vaccination Clinic  Name:  GABREILLE VILLARI    MRN: [id number] DOB: [birth year]  08/10/2019  Ms. Zamor was observed post Covid-19 immunization for 15 minutes without incident. She was provided with Vaccine Information Sheet and instruction to access the V-Safe system.   Ms. Geier was instructed to call 911 with any severe reactions post vaccine: Marland Kitchen Difficulty breathing  . Swelling of face and throat  . A fast heartbeat  . A bad rash all over body  . Dizziness and weakness   Immunizations Administered    Name Date Dose VIS Date Route   Pfizer COVID-19 Vaccine 08/10/2019 11:22 AM 0.3 mL 04/05/2019 Intramuscular   Manufacturer: [place]   Lot: [id number]   [place]: [id number]

## 2019-09-03 ENCOUNTER — Ambulatory Visit: Payer: Self-pay

## 2020-03-30 IMAGING — CR DG CHEST 1V
1 series · 1 of 1 positions shown · non-contrast
Comparison: Chest x-ray of 09/26/2006

CLINICAL DATA: Positive PPD, smoking history

EXAM:
CHEST  1 VIEW

[dg chest 1 view]
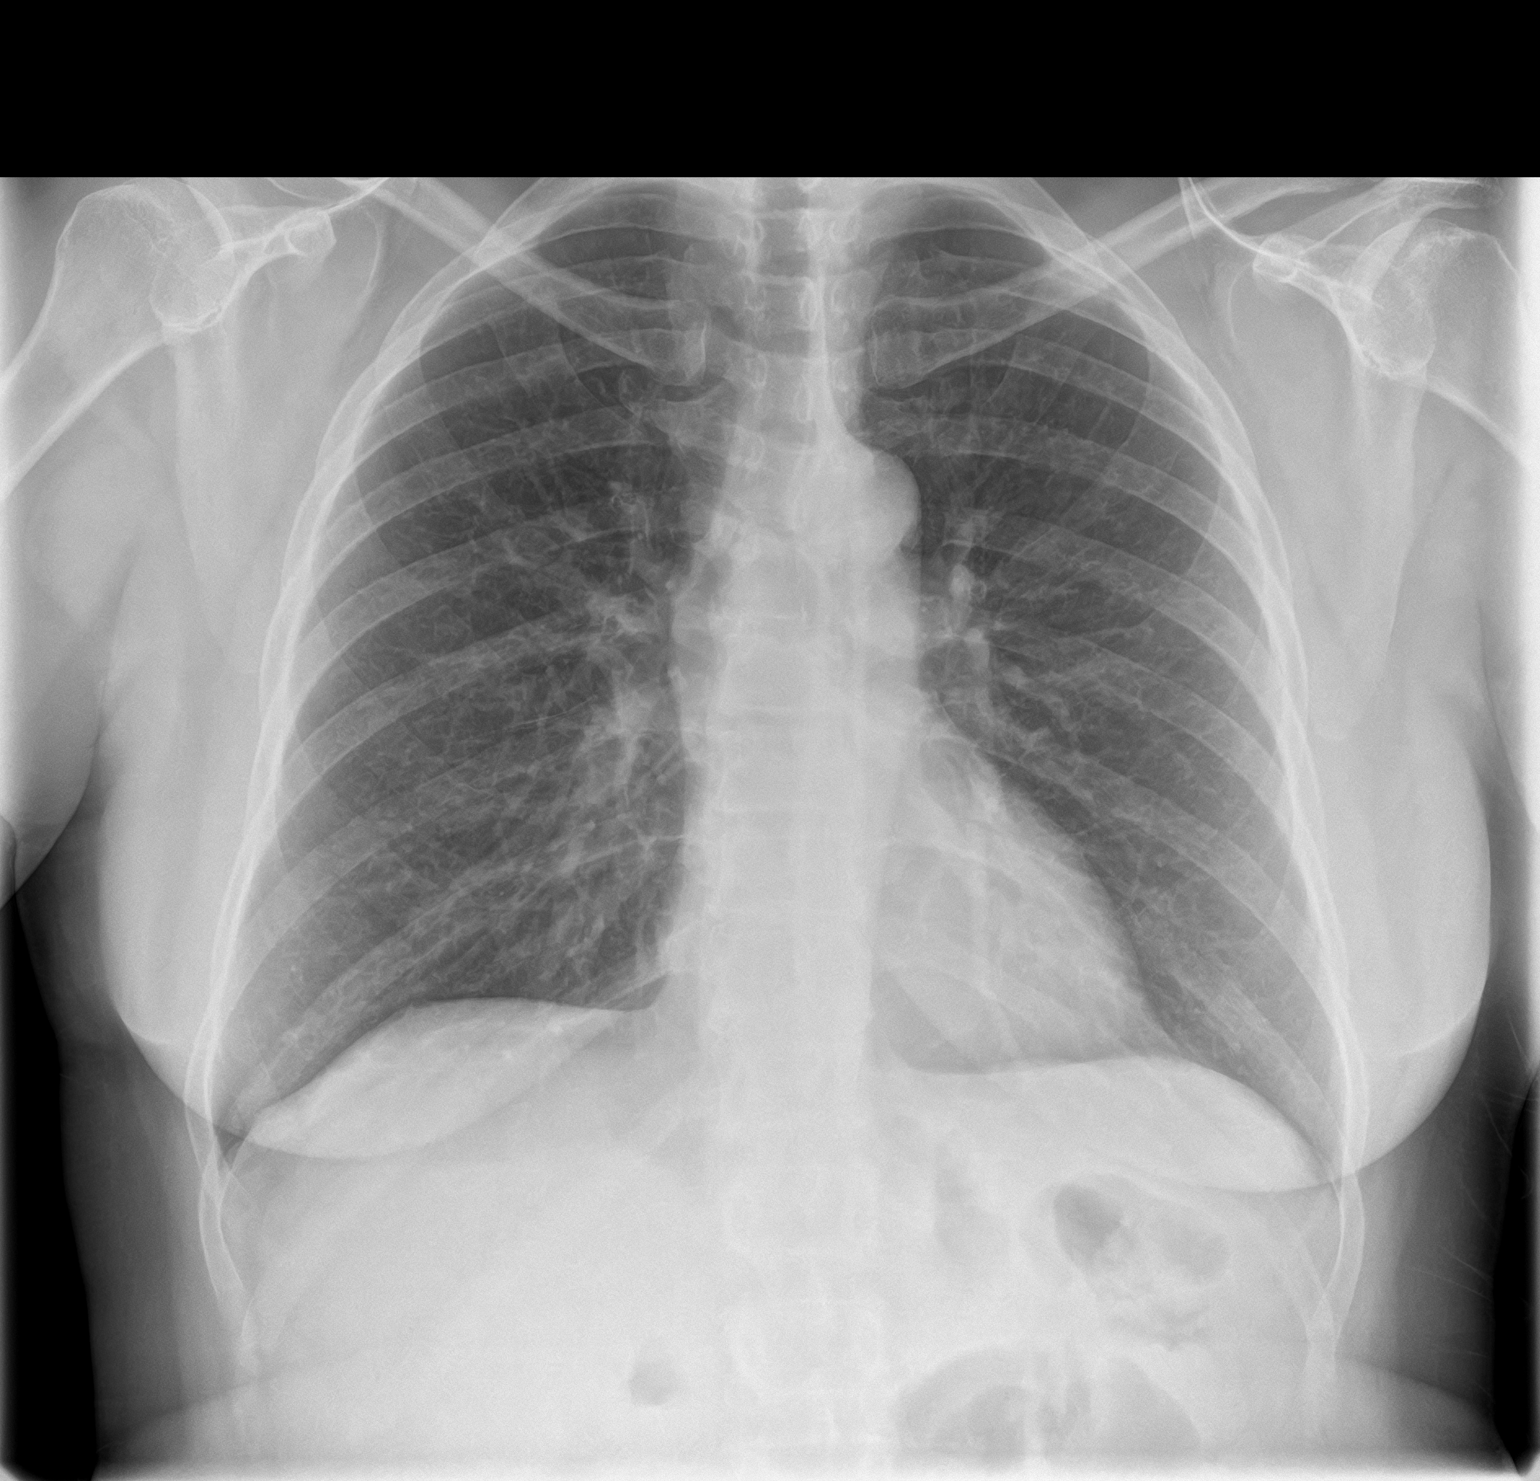

[1 of 1 positions shown; findings below may reference images not displayed]

FINDINGS: No active infiltrate or effusion is seen. No sequela of prior
tuberculous infection is evident. Mediastinal and hilar contours are
unremarkable. The heart is within normal limits in size. No bony
abnormality is seen.
IMPRESSION: No active disease. No sequela of prior tuberculous infection is
noted.

## 2021-09-23 ENCOUNTER — Emergency Department: Payer: Medicaid Other

## 2021-09-23 ENCOUNTER — Other Ambulatory Visit: Payer: Self-pay

## 2021-09-23 ENCOUNTER — Emergency Department
Admission: EM | Admit: 2021-09-23 | Discharge: 2021-09-23 | Disposition: A | Discharge status: Home / Self Care | Payer: Self-pay | Attending: Emergency Medicine | Admitting: Emergency Medicine

## 2021-09-23 DIAGNOSIS — R0981 Nasal congestion: Secondary | ICD-10-CM | POA: Insufficient documentation

## 2021-09-23 DIAGNOSIS — R0789 Other chest pain: Secondary | ICD-10-CM | POA: Insufficient documentation

## 2021-09-23 DIAGNOSIS — R059 Cough, unspecified: Secondary | ICD-10-CM | POA: Insufficient documentation

## 2021-09-23 DIAGNOSIS — R079 Chest pain, unspecified: Secondary | ICD-10-CM

## 2021-09-23 LAB — BASIC METABOLIC PANEL
Anion gap: 5 (ref 5–15)
BUN: 16 mg/dL (ref 6–20)
CO2: 26 mmol/L (ref 22–32)
Calcium: 9.1 mg/dL (ref 8.9–10.3)
Chloride: 109 mmol/L (ref 98–111)
Creatinine, Ser: 0.64 mg/dL (ref 0.44–1.00)
GFR, Estimated: >60 mL/min (ref >60)
Glucose, Bld: 95 mg/dL (ref 70–99)
Potassium: 3.9 mmol/L (ref 3.5–5.1)
Sodium: 140 mmol/L (ref 135–145)

## 2021-09-23 LAB — CBC
HCT: 42.2 % (ref 36.0–46.0)
Hemoglobin: 12.8 g/dL (ref 12.0–15.0)
MCH: 23.4 pg — ABNORMAL LOW (ref 26.0–34.0)
MCHC: 30.3 g/dL (ref 30.0–36.0)
MCV: 77 fL — ABNORMAL LOW (ref 80.0–100.0)
Platelets: 210 10*3/uL (ref 150–400)
RBC: 5.48 MIL/uL — ABNORMAL HIGH (ref 3.87–5.11)
RDW: 14.8 % (ref 11.5–15.5)
WBC: 6.8 10*3/uL (ref 4.0–10.5)
nRBC: 0 % (ref 0.0–0.2)

## 2021-09-23 LAB — TROPONIN I (HIGH SENSITIVITY): Troponin I (High Sensitivity): 3 ng/L (ref <18)

## 2021-09-23 NOTE — ED Triage Notes (Signed)
Pt states that she and her dad both have had some nasal congestion, runny nose, and cough, for the past 4-5 days, pt states that she has had some pain in her chest with coughing.

## 2021-09-23 NOTE — ED Provider Notes (Signed)
[facility] Provider Note    Event Date/Time   First MD Initiated Contact with Patient 09/23/21 1000     (approximate)  History   Chief Complaint: Nasal Congestion, Cough, and Chest Pain  HPI  Mackenzie Odonnell is a 56 y.o. female who presents to the emergency department for chest pain.  According to the patient since yesterday she has been having some sinus congestion and drainage into her chest per patient.  She has a slight cough.  States since last night she has been experiencing a mild discomfort in the center of her chest.  Continues to have it today.  Patient states could just be congestion but she was concerned so she came to the emergency department for evaluation.  No fever.  No dyspnea.  Physical Exam   Triage Vital Signs: ED Triage Vitals  Enc Vitals Group     BP 09/23/21 0944 (!) 141/88     Pulse Rate 09/23/21 0944 64     Resp 09/23/21 0944 16     Temp 09/23/21 0944 98.1 F (36.7 C)     Temp Source 09/23/21 0944 Oral     SpO2 09/23/21 0944 97 %     Weight 09/23/21 0945 201 lb (91.2 kg)     Height 09/23/21 0945 '5\' 4"'$  (1.626 m)     Head Circumference --      Peak Flow --      Pain Score 09/23/21 0945 5     Pain Loc --      Pain Edu? --      Excl. in [place]? --     Most recent vital signs: Vitals:   09/23/21 0944  BP: (!) 141/88  Pulse: 64  Resp: 16  Temp: 98.1 F (36.7 C)  SpO2: 97%    General: Awake, no distress.  CV:  Good peripheral perfusion.  Regular rate and rhythm  Resp:  Normal effort.  Equal breath sounds bilaterally.  No wheeze rales or rhonchi. Abd:  No distention.  Soft, nontender.  No rebound or guarding.    ED Results / Procedures / Treatments   EKG  EKG viewed and interpreted by myself shows a normal sinus rhythm at 72 bpm with a narrow QRS, normal axis, normal intervals, no concerning ST changes.  RADIOLOGY  I have personally reviewed the chest x-ray images no acute abnormality seen on my  evaluation. Radiology is read the chest x-ray is negative   MEDICATIONS ORDERED IN ED: Medications - No data to display   IMPRESSION / MDM / [place] / ED COURSE  I reviewed the triage vital signs and the nursing notes.  Patient's presentation is most consistent with acute presentation with potential threat to life or bodily function.  Patient presents emergency department with complaints of chest pain.  According to the patient since last night she has been experiencing chest discomfort as well as mild cough nasal congestion and drainage into the chest.  Patient denies any vomiting diaphoresis or shortness of breath.  Overall the patient appears well.  Clear lung sounds on my examination.  Patient states mild continued central chest discomfort.  Given the patient's chest pain complaints we will check labs including cardiac enzymes obtain a chest x-ray and EKG.  Differential would include ACS, pneumonia, bronchitis, URI.  Patients work-up is overall reassuring.  Normal CBC.  Reassuring chemistry.  Negative troponin.  Chest x-ray is normal.  EKG is reassuring.  Given the patient's reassuring work-up  FINAL  CLINICAL IMPRESSION(S) / ED DIAGNOSES   Chest pain   Note:  This document was prepared using Dragon voice recognition software and may include unintentional dictation errors.   Harvest Dark, MD 09/23/21 1136

## 2022-03-09 ENCOUNTER — Encounter: Payer: Self-pay | Admitting: Emergency Medicine

## 2022-03-09 ENCOUNTER — Other Ambulatory Visit: Payer: Self-pay

## 2022-03-09 ENCOUNTER — Emergency Department
Admission: EM | Admit: 2022-03-09 | Discharge: 2022-03-09 | Disposition: A | Discharge status: Home / Self Care | Payer: Self-pay | Attending: Emergency Medicine | Admitting: Emergency Medicine

## 2022-03-09 DIAGNOSIS — R197 Diarrhea, unspecified: Secondary | ICD-10-CM | POA: Insufficient documentation

## 2022-03-09 LAB — CBC
HCT: 42.6 % (ref 36.0–46.0)
Hemoglobin: 13.1 g/dL (ref 12.0–15.0)
MCH: 23.5 pg — ABNORMAL LOW (ref 26.0–34.0)
MCHC: 30.8 g/dL (ref 30.0–36.0)
MCV: 76.5 fL — ABNORMAL LOW (ref 80.0–100.0)
Platelets: 215 10*3/uL (ref 150–400)
RBC: 5.57 MIL/uL — ABNORMAL HIGH (ref 3.87–5.11)
RDW: 14.8 % (ref 11.5–15.5)
WBC: 5.4 10*3/uL (ref 4.0–10.5)
nRBC: 0 % (ref 0.0–0.2)

## 2022-03-09 LAB — COMPREHENSIVE METABOLIC PANEL
ALT: 17 U/L (ref 0–44)
AST: 19 U/L (ref 15–41)
Albumin: 4 g/dL (ref 3.5–5.0)
Alkaline Phosphatase: 55 U/L (ref 38–126)
Anion gap: 8 (ref 5–15)
BUN: 15 mg/dL (ref 6–20)
CO2: 26 mmol/L (ref 22–32)
Calcium: 9.6 mg/dL (ref 8.9–10.3)
Chloride: 109 mmol/L (ref 98–111)
Creatinine, Ser: 0.77 mg/dL (ref 0.44–1.00)
GFR, Estimated: >60 mL/min (ref >60)
Glucose, Bld: 83 mg/dL (ref 70–99)
Potassium: 4 mmol/L (ref 3.5–5.1)
Sodium: 143 mmol/L (ref 135–145)
Total Bilirubin: 0.6 mg/dL (ref 0.3–1.2)
Total Protein: 7.7 g/dL (ref 6.5–8.1)

## 2022-03-09 LAB — LIPASE, BLOOD: Lipase: 34 U/L (ref 11–51)

## 2022-03-09 NOTE — ED Provider Notes (Signed)
[facility] Provider Note    Event Date/Time   First MD Initiated Contact with Patient 03/09/22 [phone]     (approximate)   History   Diarrhea   HPI  Mackenzie Odonnell is a 56 y.o. female who presents to the ED from work with a chief complaint of diarrhea.  Patient reports 2 loose stools last night before going to bed.  Went to work this morning at [employer] and has had 4 loose bowel movements.  Denies associated fever, chest pain, shortness of breath, abdominal pain, nausea or vomiting.  States she thinks diarrhea is related to the junk food she has been eating over the past couple of days; most recently ate fried chicken last night.  Denies personal or family history of inflammatory bowel disease.  Denies recent travel, trauma, camping or antibiotic use.     Past Medical History   Past Medical History:  Diagnosis Date   Renal disorder      Active Problem List  There are no problems to display for this patient.    Past Surgical History   Past Surgical History:  Procedure Laterality Date   ANKLE SURGERY       Home Medications   Prior to Admission medications   Medication Sig Start Date End Date Taking? Authorizing Provider  traMADol (ULTRAM) 50 MG tablet Take 1 tablet (50 mg total) by mouth 2 (two) times daily. 04/13/17   Menshew, Dannielle Karvonen, PA-C     Allergies  Patient has no known allergies.   Family History  No family history on file.   Physical Exam  Triage Vital Signs: ED Triage Vitals  Enc Vitals Group     BP 03/09/22 0604 123/88     Pulse Rate 03/09/22 0604 85     Resp 03/09/22 0604 18     Temp 03/09/22 0604 98 F (36.7 C)     Temp Source 03/09/22 0604 Oral     SpO2 03/09/22 0604 98 %     Weight 03/09/22 0605 189 lb (85.7 kg)     Height 03/09/22 0605 '5\' 4"'$  (1.626 m)     Head Circumference --      Peak Flow --      Pain Score 03/09/22 0604 0     Pain Loc --      Pain Edu? --      Excl. in [place]? --     Updated  Vital Signs: BP 123/88 (BP Location: Right Arm)   Pulse 85   Temp 98 F (36.7 C) (Oral)   Resp 18   Ht '5\' 4"'$  (1.626 m)   Wt 85.7 kg   LMP  (LMP Unknown)   SpO2 98%   BMI 32.44 kg/m    General: Awake, no distress.  CV:  RRR.  Good peripheral perfusion.  Resp:  Normal effort.  CTA B. Abd:  Nontender to light or deep palpation.  No distention.  Other:  No truncal vesicles.   ED Results / Procedures / Treatments  Labs (all labs ordered are listed, but only abnormal results are displayed) Labs Reviewed  CBC - Abnormal; Notable for the following components:      Result Value   RBC 5.57 (*)    MCV 76.5 (*)    MCH 23.5 (*)    All other components within normal limits  LIPASE, BLOOD  COMPREHENSIVE METABOLIC PANEL  URINALYSIS, ROUTINE W REFLEX MICROSCOPIC     EKG  None   RADIOLOGY None  Official radiology report(s): No results found.   PROCEDURES:  Critical Care performed: No  Procedures   MEDICATIONS ORDERED IN ED: Medications - No data to display   IMPRESSION / MDM / [place] / ED COURSE  I reviewed the triage vital signs and the nursing notes.                             56 year old female presenting with diarrhea. Differential diagnosis includes, but is not limited to, ovarian cyst, ovarian torsion, acute appendicitis, diverticulitis, urinary tract infection/pyelonephritis, endometriosis, bowel obstruction, colitis, renal colic, gastroenteritis, hernia, etc. I have personally reviewed patient's records and note [facility] ED visits, most recently in June of this year for chest pain.  Patient's presentation is most consistent with acute complicated illness / injury requiring diagnostic workup.  Lab work is pending.  Hat placed in toilet in the event patient needs to have a bowel movement.  We discussed proceeding with imaging but given patient has benign abdominal exam, we both agreed to hold imaging at this time.  Will reassess.  Clinical  Course as of 03/09/22 0711  Wed Mar 09, 2022  0710 Updated patient on unremarkable lab work.  She denies urinary symptoms and does not wish to give urine specimen.  Strict return precautions given.  Patient verbalizes understanding and agrees with plan of care. [JS]    Clinical Course User Index [JS] Paulette Blanch, MD     FINAL CLINICAL IMPRESSION(S) / ED DIAGNOSES   Final diagnoses:  Diarrhea, unspecified type     Rx / DC Orders   ED Discharge Orders     None        Note:  This document was prepared using Dragon voice recognition software and may include unintentional dictation errors.   Paulette Blanch, MD 03/09/22 [phone]

## 2022-03-09 NOTE — ED Notes (Signed)
Pt reports "feel better, ready to go".

## 2022-03-09 NOTE — ED Triage Notes (Signed)
Pt arrived via POV with reports of diarrhea that started 2 days ago and worse overnight, pt states she went to work this morning and started having more bouts of diarrhea. Pt reports the stool is watery.  Denies any sick contacts but states she does work at [employer].  Pt states she ate fried chicken last night from [employer]. Denies any abd pain.   Pt states she needs a note to return to work.

## 2022-03-09 NOTE — Discharge Instructions (Signed)
Eat BRAT diet x 3 days and advance diet as tolerated. Return to the ER for worsening symptoms, persistent vomiting, difficulty breathing or other concerns.

## 2022-04-04 ENCOUNTER — Ambulatory Visit
Admission: EM | Admit: 2022-04-04 | Discharge: 2022-04-04 | Disposition: A | Discharge status: Home / Self Care | Payer: Medicaid Other

## 2022-04-04 ENCOUNTER — Encounter: Payer: Self-pay | Admitting: Emergency Medicine

## 2022-04-04 DIAGNOSIS — J069 Acute upper respiratory infection, unspecified: Secondary | ICD-10-CM | POA: Diagnosis not present

## 2022-04-04 NOTE — Discharge Instructions (Signed)
Continue the over-the-counter medications including Tylenol, Mucinex, Flonase.  Make sure that you rest and drink plenty of fluid.  You can return to work tomorrow but should wear a mask when around other individuals.  If your symptoms worsen anyway and you develop chest pain, shortness of breath, nausea/vomiting interfere with oral intake, fever you should be seen immediately.

## 2022-04-04 NOTE — ED Provider Notes (Signed)
[facility]    CSN: [id number] Arrival date & time: 04/04/22  1147      History   Chief Complaint Chief Complaint  Patient presents with   Cough    HPI Mackenzie Odonnell is a 56 y.o. female.   Patient presents today with a 3 to 4-day history of URI symptoms.  She reports congestion and cough.  Symptoms began after she was outside for a [place] period of time for her job.  She stayed at home for several days from work and started taking vitamin C, orange juice, cough drops.  Reports she has had a significant improvement of symptoms and now only has a mild lingering cough.  She is requesting a return to work note if appropriate today.  She took 2 at home COVID tests that were both negative.  She denies any known sick contacts.  Denies any significant past medical history including allergies, asthma, COPD.  Denies any recent antibiotics or steroids.  She is a current everyday smoker smoking her typical amount.    Past Medical History:  Diagnosis Date   Renal disorder     There are no problems to display for this patient.   Past Surgical History:  Procedure Laterality Date   ANKLE SURGERY      OB History   No obstetric history on file.      Home Medications    Prior to Admission medications   Medication Sig Start Date End Date Taking? Authorizing Provider  traMADol (ULTRAM) 50 MG tablet Take 1 tablet (50 mg total) by mouth 2 (two) times daily. 04/13/17   Menshew, Dannielle Karvonen, PA-C    Family History History reviewed. No pertinent family history.  Social History Social History   Tobacco Use   Smoking status: Every Day    Packs/day: 0.50    Types: Cigarettes   Smokeless tobacco: Never  Vaping Use   Vaping Use: Never used  Substance Use Topics   Alcohol use: Yes   Drug use: Never     Allergies   Patient has no known allergies.   Review of Systems Review of Systems  Constitutional:  Positive for activity change. Negative for appetite change,  fatigue and fever.  HENT:  Negative for congestion, sinus pressure, sneezing and sore throat.   Respiratory:  Positive for cough (Improved). Negative for shortness of breath.   Cardiovascular:  Negative for chest pain.  Gastrointestinal:  Negative for abdominal pain, diarrhea, nausea and vomiting.  Neurological:  Negative for dizziness, light-headedness and headaches.     Physical Exam Triage Vital Signs ED Triage Vitals  Enc Vitals Group     BP 04/04/22 1425 129/81     Pulse Rate 04/04/22 1425 76     Resp 04/04/22 1425 18     Temp 04/04/22 1425 98.5 F (36.9 C)     Temp Source 04/04/22 1425 Oral     SpO2 04/04/22 1425 99 %     Weight --      Height --      Head Circumference --      Peak Flow --      Pain Score 04/04/22 1424 0     Pain Loc --      Pain Edu? --      Excl. in [place]? --    No data found.  Updated Vital Signs BP 129/81 (BP Location: Left Arm)   Pulse 76   Temp 98.5 F (36.9 C) (Oral)   Resp 18  LMP  (LMP Unknown)   SpO2 99%   Visual Acuity Right Eye Distance:   Left Eye Distance:   Bilateral Distance:    Right Eye Near:   Left Eye Near:    Bilateral Near:     Physical Exam Vitals reviewed.  Constitutional:      General: She is awake. She is not in acute distress.    Appearance: Normal appearance. She is well-developed. She is not ill-appearing.     Comments: Very pleasant female appears stated age in no acute distress sitting comfortably in exam room  HENT:     Head: Normocephalic and atraumatic.     Right Ear: Tympanic membrane, ear canal and external ear normal. Tympanic membrane is not erythematous or bulging.     Left Ear: Tympanic membrane, ear canal and external ear normal. Tympanic membrane is not erythematous or bulging.     Nose:     Right Sinus: No maxillary sinus tenderness or frontal sinus tenderness.     Left Sinus: No maxillary sinus tenderness or frontal sinus tenderness.     Mouth/Throat:     Pharynx: Uvula midline. No  oropharyngeal exudate or posterior oropharyngeal erythema.  Cardiovascular:     Rate and Rhythm: Normal rate and regular rhythm.     Heart sounds: Normal heart sounds, S1 normal and S2 normal. No murmur heard. Pulmonary:     Effort: Pulmonary effort is normal.     Breath sounds: Normal breath sounds. No wheezing, rhonchi or rales.     Comments: Clear to auscultation bilaterally Psychiatric:        Behavior: Behavior is cooperative.      UC Treatments / Results  Labs (all labs ordered are listed, but only abnormal results are displayed) Labs Reviewed - No data to display  EKG   Radiology No results found.  Procedures Procedures (including critical care time)  Medications Ordered in UC Medications - No data to display  Initial Impression / Assessment and Plan / UC Course  I have reviewed the triage vital signs and the nursing notes.  Pertinent labs & imaging results that were available during my care of the patient were reviewed by me and considered in my medical decision making (see chart for details).     Patient is well-appearing, afebrile, nontoxic, nontachycardic.  No evidence of acute infection on physical exam that would warrant initiation of antibiotics.  Offered viral testing for COVID/flu the patient declined this as she is already feeling better and had negative COVID test at home.  She was encouraged to continue using over-the-counter medication including Mucinex, Flonase, Tylenol.  She is to rest and drink plenty of fluid.  Discussed that if her symptoms return or if any point anything worsens and she develops worsening cough, shortness of breath, fever, nausea/vomiting interfere with oral intake she needs to be seen immediately.  Strict return precautions given.  Work excuse note provided.  Final Clinical Impressions(s) / UC Diagnoses   Final diagnoses:  Viral URI with cough     Discharge Instructions      Continue the over-the-counter medications  including Tylenol, Mucinex, Flonase.  Make sure that you rest and drink plenty of fluid.  You can return to work tomorrow but should wear a mask when around other individuals.  If your symptoms worsen anyway and you develop chest pain, shortness of breath, nausea/vomiting interfere with oral intake, fever you should be seen immediately.     ED Prescriptions   None  PDMP not reviewed this encounter.   Terrilee Croak, PA-C 04/04/22 1437

## 2022-04-04 NOTE — ED Triage Notes (Signed)
Pt presents with a cough x 3 days. Pt needs a note to return to work.

## 2023-11-23 IMAGING — DX DG CHEST 1V PORT
1 series · 1 of 1 positions shown · non-contrast
Comparison: January 29, 2018.

CLINICAL DATA: Chest pain.

EXAM:
PORTABLE CHEST 1 VIEW

[chest ap]
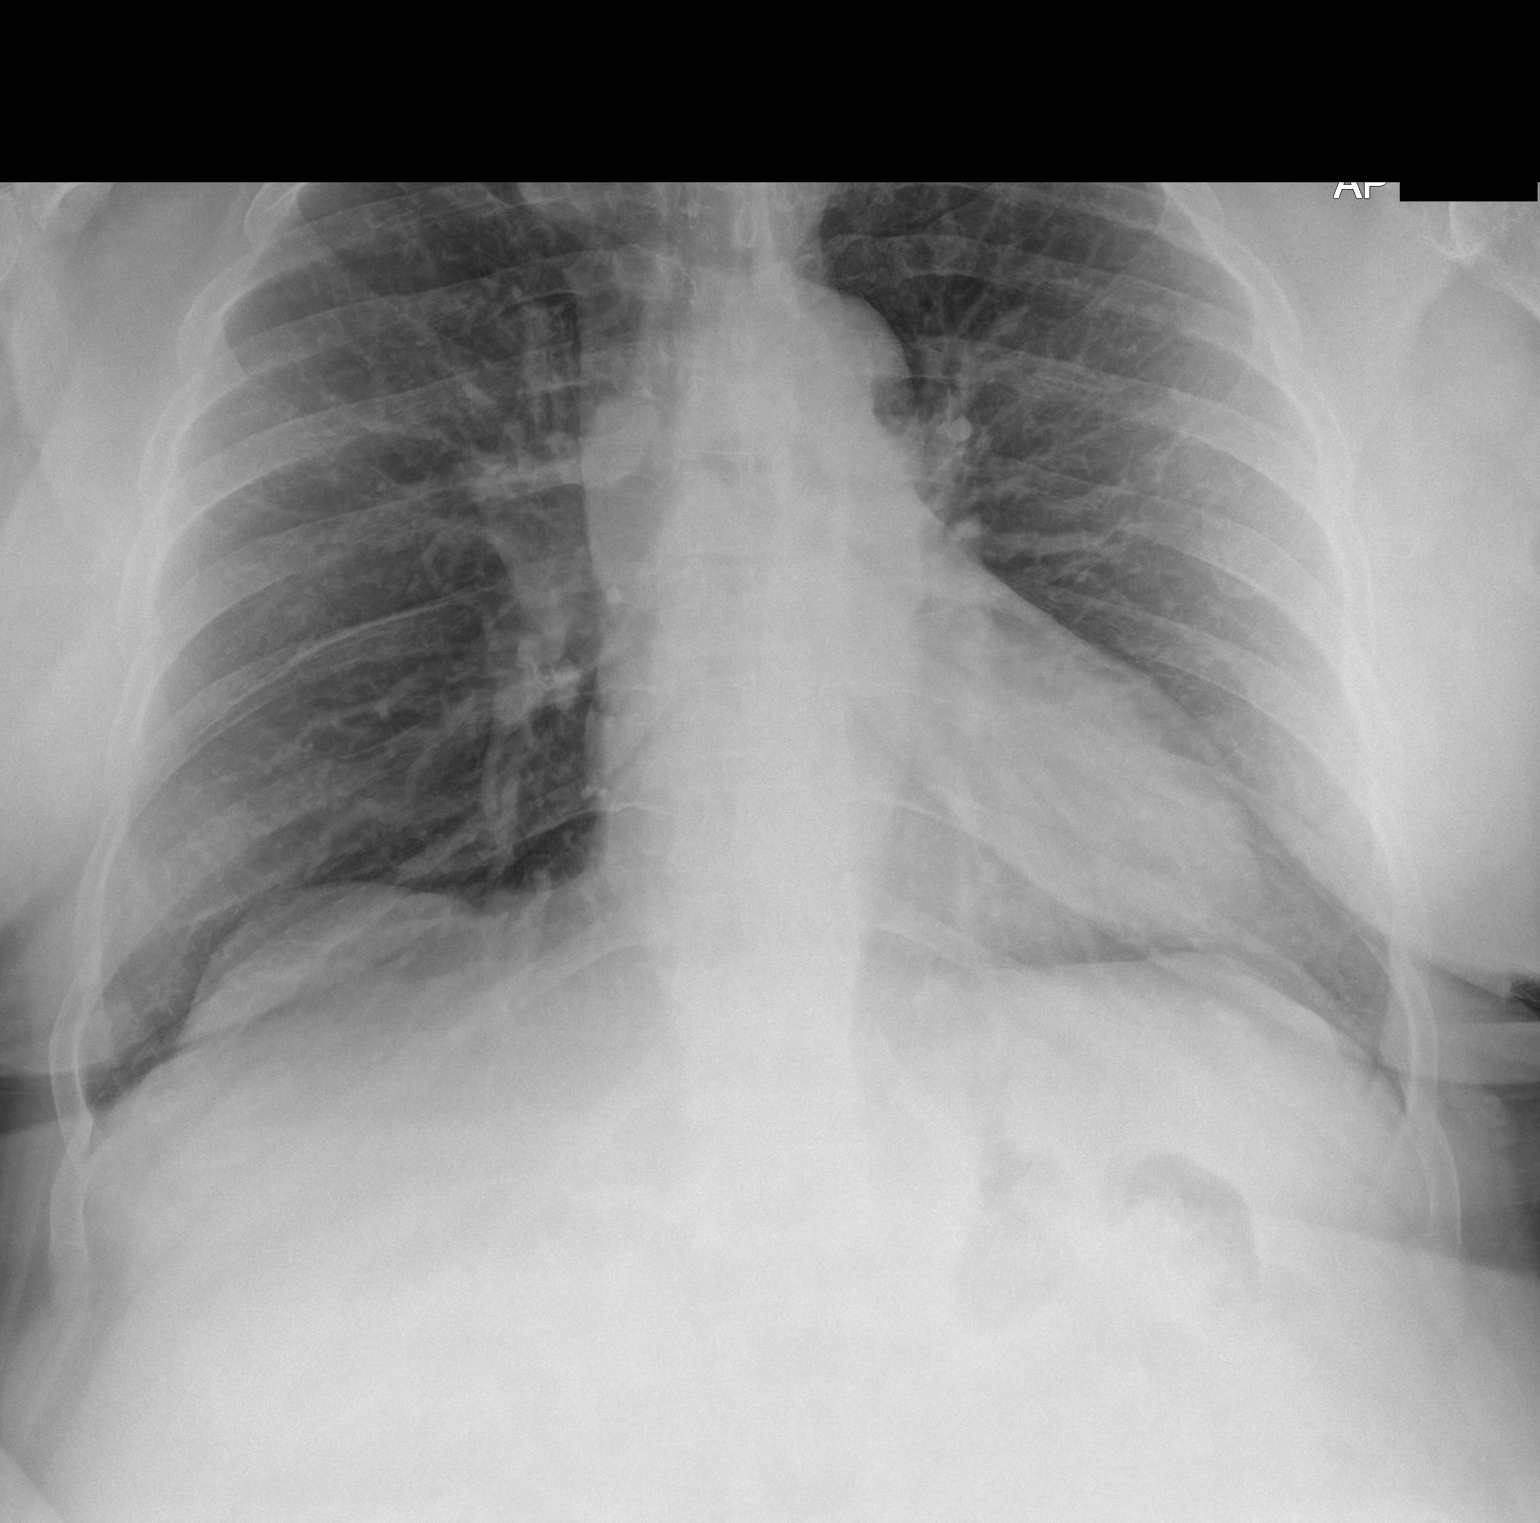

[1 of 1 positions shown; findings below may reference images not displayed]

FINDINGS: The heart size and mediastinal contours are within normal limits.
Both lungs are clear. The visualized skeletal structures are
unremarkable.
IMPRESSION: No active disease.
